# Patient Record
Sex: Male | Born: 2005 | Race: Black or African American | Hispanic: No | Marital: Single | Smoking: Current some day smoker
Health system: Southern US, Community
[De-identification: ages and names within clinical notes are randomized; demographics above are authoritative.]

## PROBLEM LIST (undated history)

## (undated) ENCOUNTER — Inpatient Hospital Stay: Admission: EM | Payer: Self-pay | Source: Home / Self Care

## (undated) DIAGNOSIS — I82409 Acute embolism and thrombosis of unspecified deep veins of unspecified lower extremity: Secondary | ICD-10-CM

## (undated) HISTORY — PX: TONSILLECTOMY: SUR1361

## (undated) HISTORY — PX: TONSILLECTOMY AND ADENOIDECTOMY: SHX28

---
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
## 2023-01-01 LAB — CARDIOLIPIN ANTIBODIES, IGM+IGG
Anticardiolipin IgG: <9 GPL U/mL (ref 0–14)
Anticardiolipin IgM: <9 MPL U/mL (ref 0–12)

## 2023-01-01 LAB — FACTOR 8 ASSAY: Coagulation Factor VIII: 139 % (ref 56–140)

## 2023-01-01 LAB — RHEUMATOID FACTOR: Rheumatoid fact SerPl-aCnc: <10 IU/mL (ref <14.0)

## 2023-01-02 LAB — HGB FRACTIONATION CASCADE
Hgb A2: 3 % (ref 1.8–3.2)
Hgb A: 97 % (ref 96.4–98.8)
Hgb F: 0 % (ref 0.0–2.0)
Hgb S: 0 %

## 2023-01-03 ENCOUNTER — Telehealth: Payer: Self-pay | Admitting: Oncology

## 2023-01-03 LAB — BETA-2-GLYCOPROTEIN I ABS, IGG/M/A
Beta-2 Glyco I IgG: <9 GPI IgG units (ref 0–20)
Beta-2-Glycoprotein I IgA: <9 GPI IgA units (ref 0–25)
Beta-2-Glycoprotein I IgM: <9 GPI IgM units (ref 0–32)

## 2023-01-03 NOTE — Telephone Encounter (Signed)
Left patient a vm regarding upcoming appointments  

## 2023-01-05 DIAGNOSIS — Z91199 Patient's noncompliance with other medical treatment and regimen due to unspecified reason: Secondary | ICD-10-CM | POA: Insufficient documentation

## 2023-01-05 DIAGNOSIS — F1210 Cannabis abuse, uncomplicated: Secondary | ICD-10-CM | POA: Insufficient documentation

## 2023-01-05 DIAGNOSIS — Z7901 Long term (current) use of anticoagulants: Secondary | ICD-10-CM | POA: Insufficient documentation

## 2023-01-05 LAB — URINE DRUGS OF ABUSE SCREEN W ALC, ROUTINE (REF LAB)
Amphetamines, Urine: NEGATIVE ng/mL
Barbiturate, Ur: NEGATIVE ng/mL
Benzodiazepine Quant, Ur: NEGATIVE ng/mL
Cocaine (Metab.): NEGATIVE ng/mL
Ethanol U, Quan: NEGATIVE %
Methadone Screen, Urine: NEGATIVE ng/mL
Opiate Quant, Ur: NEGATIVE ng/mL
Phencyclidine, Ur: NEGATIVE ng/mL
Propoxyphene, Urine: NEGATIVE ng/mL

## 2023-01-05 LAB — PANEL 799049
CARBOXY THC GC/MS CONF: 8550 ng/mL
Cannabinoid GC/MS, Ur: POSITIVE — AB

## 2023-01-20 ENCOUNTER — Inpatient Hospital Stay: Payer: Medicaid Other

## 2023-01-20 ENCOUNTER — Inpatient Hospital Stay: Payer: Medicaid Other | Attending: Oncology | Admitting: Oncology

## 2023-01-20 NOTE — Progress Notes (Deleted)
[facility] Follow up Visit:  Patient Care Team: Leslie Dales, DO as PCP - General (Family Medicine)  CHIEF COMPLAINTS/PURPOSE OF CONSULTATION:   HISTORY OF PRESENTING ILLNESS: Ricky Carroll 17 y.o. 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  Was found to have a right calf vein DVT.  No inciting events recalled but may have had a viral illness in the weeks prior.  Was treated with heparin followed by rivaroxaban  CTPA.  Bilateral pulmonary emboli right greater than left with evidence of mild right heart strain Bilateral lower extremity Dopplers notable for thrombus throughout the right lower extremity deep system WBC 13.0 hemoglobin 14.7 platelet count 344; 76 segs 11 lymphs 11 monos 1 EO CMP notable for glucose of 132 chloride 97 glucose 105 COVID negative Ethyl alcohol negative Lupus anticoagulant testing negative Protein see activity 71% S activity 32% plasminogen activity 102% Prothrombin gene mutation testing negative factor V Leiden gene mutation testing negative  November 24, 2022: CT PA small volume right greater than left pulmonary emboli decreased clot burden compared with January 17, 2022.  Given longtime interval favor to represent acute/subacute rather than chronic residual thrombus.  Diffuse groundglass nodularity most confluent in left lower lobe.  Mild thoracic adenopathy favored to be reactive Patient tested positive for parainfluenza virus 1  December 30 2022:  [facility] Hematology Consult Patient states he was on anticoagulation for only about 2 months following the first PE because her ran out of medicine then missed follow up visit.  He states that he has been on Xarelto since diagnosis of PE in December 2023.  Claims compliance with medication.  Prior to each event he had cramping of legs and vomiting.  No SOB, chest pain.  Social:  Single.  High school student.  Smokes marijuana a few  times a day.  Does not smoke cigarettes.  Does not vape.  EtOH not really.  Denies use of other drugs  [facility] Mother alive 54 well Father alive 39 had DVT  Has 43 half brothers and 71 half sisters.  No history of DVT  WBC 5.6 hemoglobin 16.6 platelet count 203; 53 segs 31 lymphs 11 monos 3 eos 1 basophil Hemoglobin electrophoresis showed normal adult pattern Anticardiolipin antibody negative antibeta 2 glycoprotein antibody negative. Fibrinogen 459 D-dimer 0.33 factor VIII 139 ANA panel and rheumatoid factor negative CMP notable for glucose of 69 creatinine 0.74 Urine drug screen strongly positive for THC  Review of Systems  Constitutional:  Positive for unexpected weight change. Negative for appetite change, chills, fatigue and fever.       Occasionally has has night sweats.  Has lost weight in the past year  HENT:   Negative for lump/mass, mouth sores, nosebleeds, sore throat, tinnitus, trouble swallowing and voice change.   Eyes:  Negative for eye problems and icterus.       Vision changes:  None  Respiratory:  Negative for chest tightness, hemoptysis, shortness of breath and wheezing.        Slight cough.  Scant sputum production   Cardiovascular:  Negative for chest pain, leg swelling and palpitations.       PND:  none Orthopnea:  none  Gastrointestinal:  Negative for abdominal distention, abdominal pain, blood in stool, constipation, diarrhea, nausea and vomiting.  Endocrine: Negative for hot flashes.       Cold intolerance:  none Heat intolerance:  none  Musculoskeletal:  Negative for arthralgias, back  pain, gait problem, myalgias, neck pain and neck stiffness.  Skin:  Negative for itching, rash and wound.  Neurological:  Negative for dizziness, extremity weakness, gait problem, headaches, light-headedness, numbness, seizures and speech difficulty.  Hematological:  Negative for adenopathy. Does not bruise/bleed easily.  Psychiatric/Behavioral:  Negative for sleep disturbance and  suicidal ideas. The patient is not nervous/anxious.     MEDICAL HISTORY: Past Medical History:  Diagnosis Date   DVT (deep venous thrombosis) (HCC)     SURGICAL HISTORY: Past Surgical History:  Procedure Laterality Date   TONSILLECTOMY     TONSILLECTOMY     TONSILLECTOMY AND ADENOIDECTOMY Bilateral     SOCIAL HISTORY: Social History   Socioeconomic History   Marital status: Single    Spouse name: Not on file   Number of children: Not on file   Years of education: Not on file   Highest education level: Not on file  Occupational History   Not on file  Tobacco Use   Smoking status: Never    Passive exposure: Yes   Smokeless tobacco: Never  Substance and Sexual Activity   Alcohol use: No   Drug use: No   Sexual activity: Never  Other Topics Concern   Not on file  Social History Narrative   Hoyte is in fifth grade at [employer]. He is doing well.   Lives with his mother and older brother. Mother is expecting her third child.   Social Determinants of Health   Financial Resource Strain: Not on file  Food Insecurity: No Food Insecurity (11/28/2022)   Hunger Vital Sign    Worried About Running Out of Food in the Last Year: Never true    Ran Out of Food in the Last Year: Never true  Transportation Needs: No Transportation Needs (11/28/2022)   PRAPARE - Hydrologist (Medical): No    Lack of Transportation (Non-Medical): No  Physical Activity: Not on file  Stress: Not on file  Social Connections: Not on file  Intimate Partner Violence: Not At Risk (11/28/2022)   Humiliation, Afraid, Rape, and Kick questionnaire    Fear of Current or Ex-Partner: No    Emotionally Abused: No    Physically Abused: No    Sexually Abused: No    FAMILY HISTORY Family History  Problem Relation Age of Onset   Migraines Mother     ALLERGIES:  has No Known Allergies.  MEDICATIONS:  Current Outpatient Medications  Medication Sig Dispense  Refill   acetaminophen (TYLENOL) 325 MG tablet Take 2 tablets (650 mg total) by mouth every 6 (six) hours as needed for mild pain or moderate pain. (Patient not taking: Reported on 12/01/2022)     feeding supplement (ENSURE ENLIVE / ENSURE PLUS) LIQD Take 237 mLs by mouth 2 (two) times daily between meals. (Patient not taking: Reported on 12/01/2022) 237 mL 12   rivaroxaban (XARELTO) 20 MG TABS tablet Take 1 tablet (20 mg total) by mouth daily with supper. 30 tablet 1   rivaroxaban (XARELTO) 20 MG TABS tablet Take 1 tablet (20 mg total) by mouth daily with supper. 7 tablet 0   No current facility-administered medications for this visit.    PHYSICAL EXAMINATION:  ECOG PERFORMANCE STATUS: 0 - Asymptomatic   There were no vitals filed for this visit.   There were no vitals filed for this visit.    Physical Exam Vitals and nursing note reviewed.  Constitutional:      Appearance: Normal appearance. He  is not diaphoretic.     Comments: Here with mother.  Apparent odor of cannabis eminating from patient   HENT:     Head: Normocephalic and atraumatic.     Right Ear: External ear normal.     Left Ear: External ear normal.     Nose: Nose normal.  Eyes:     General: No scleral icterus.    Conjunctiva/sclera: Conjunctivae normal.     Pupils: Pupils are equal, round, and reactive to light.  Cardiovascular:     Rate and Rhythm: Normal rate and regular rhythm.     Heart sounds: Normal heart sounds. No murmur heard.    No friction rub. No gallop.  Pulmonary:     Effort: Pulmonary effort is normal. No respiratory distress.     Breath sounds: Normal breath sounds. No stridor. No wheezing or rhonchi.  Abdominal:     General: Bowel sounds are normal.     Palpations: Abdomen is soft.     Tenderness: There is no abdominal tenderness. There is no guarding.  Musculoskeletal:        General: No swelling, tenderness or signs of injury. Normal range of motion.     Cervical back: Normal range of  motion and neck supple. No rigidity or tenderness.  Lymphadenopathy:     Head:     Right side of head: No submental, submandibular, tonsillar, preauricular, posterior auricular or occipital adenopathy.     Left side of head: No submental, submandibular, tonsillar, preauricular, posterior auricular or occipital adenopathy.     Cervical: No cervical adenopathy.     Right cervical: No superficial, deep or posterior cervical adenopathy.    Left cervical: No superficial, deep or posterior cervical adenopathy.     Upper Body:     Right upper body: No supraclavicular or axillary adenopathy.     Left upper body: No supraclavicular or axillary adenopathy.     Lower Body: No right inguinal adenopathy. No left inguinal adenopathy.  Skin:    Coloration: Skin is not jaundiced.     Findings: No bruising.  Neurological:     General: No focal deficit present.     Mental Status: He is alert and oriented to person, place, and time.     Cranial Nerves: No cranial nerve deficit.  Psychiatric:        Mood and Affect: Mood normal.        Thought Content: Thought content normal.     Comments: Judgement limited.        LABORATORY DATA: I have personally reviewed the data as listed:  Clinical Support on 12/30/2022  Component Date Value Ref Range Status   Rhuematoid fact SerPl-aCnc 12/30/2022 <10.0  <14.0 IU/mL Final   Comment: (NOTE) Performed At: [facility] [place], [place] [id number] Rush Farmer MD [id number]    ds DNA Ab 12/30/2022 <1  0 - 9 IU/mL Final   Comment: (NOTE)                                   Negative      <5                                   Equivocal  5 - 9  Positive      >9    Ribonucleic Protein 12/30/2022 <0.2  0.0 - 0.9 AI Final   ENA SM Ab Ser-aCnc 12/30/2022 <0.2  0.0 - 0.9 AI Final   Scleroderma (Scl-70) (ENA) Antibod* 12/30/2022 <0.2  0.0 - 0.9 AI Final   SSA (Ro) (ENA) Antibody, IgG 12/30/2022 <0.2  0.0 -  0.9 AI Final   SSB (La) (ENA) Antibody, IgG 12/30/2022 <0.2  0.0 - 0.9 AI Final   Chromatin Ab SerPl-aCnc 12/30/2022 <0.2  0.0 - 0.9 AI Final   Anti JO-1 12/30/2022 <0.2  0.0 - 0.9 AI Final   Centromere Ab Screen 12/30/2022 <0.2  0.0 - 0.9 AI Final   See below: 12/30/2022 Comment   Final   Comment: (NOTE) Autoantibody                       Disease Association ------------------------------------------------------------                        Condition                  Frequency ---------------------   ------------------------   --------- Antinuclear Antibody,    SLE, mixed connective Direct (ANA-D)           tissue diseases ---------------------   ------------------------   --------- dsDNA                    SLE                        40 - 60% ---------------------   ------------------------   --------- Chromatin                Drug induced SLE                90%                         SLE                        48 - 97% ---------------------   ------------------------   --------- SSA (Ro)                 SLE                        25 - 35%                         Sjogren's Syndrome         40 - 70%                         Neonatal Lupus                 100% ---------------------   ------------------------   --------- SSB (La)                 SLE                                                       10%  Sjogren's Syndrome              30% ---------------------   -----------------------    --------- Sm (anti-Smith)          SLE                        15 - 30% ---------------------   -----------------------    --------- RNP                      Mixed Connective Tissue                         Disease                         95% (U1 nRNP,                SLE                        30 - 50% anti-ribonucleoprotein)  Polymyositis and/or                         Dermatomyositis                 20% ---------------------   ------------------------   --------- Scl-70  (antiDNA          Scleroderma (diffuse)      20 - 35% topoisomerase)           Crest                           13% ---------------------   ------------------------   --------- Jo-1                     Polymyositis and/or                         Dermatomyositis            20 - 40% ---------------------   ------------------------   --------- Centromere B             Scleroderma -                           Crest                         variant                         80% Performed At: [facility] [place], [place] [id number] Rush Farmer MD [id number]    Hgb F 12/30/2022 0.0  0.0 - 2.0 % Final   Hgb A 12/30/2022 97.0  96.4 - 98.8 % Final   Hgb A2 12/30/2022 3.0  1.8 - 3.2 % Final   Hgb S 12/30/2022 0.0  0.0 % Final   Interpretation, Hgb Fract 12/30/2022 Comment   Final   Comment: (NOTE) Normal hemoglobin present; no hemoglobin variant or beta thalassemia identified. Note: Alpha thalassemia may not be detected by the Hgb Fractionation Cascade panel. If alpha thalassemia is suspected, [facility] offers Alpha-Thalassemia DNA Analysis [id number]). Performed At: [facility] [place], [place] [id number] Rush Farmer MD [id number]  Amphetamines, Urine 12/30/2022 Negative  Cutoff=1000 ng/mL Final   Amphetamine test includes Amphetamine and Methamphetamine.   Barbiturate, Ur 12/30/2022 Negative  Cutoff=300 ng/mL Final   Benzodiazepine Quant, Ur 12/30/2022 Negative  Cutoff=300 ng/mL Final   Cannabinoid Quant, Ur 12/30/2022 See Final Results  Cutoff=50 ng/mL Final   Cocaine (Metab.) 12/30/2022 Negative  Cutoff=300 ng/mL Final   Opiate Quant, Ur 12/30/2022 Negative  Cutoff=300 ng/mL Final   Opiate test includes Codeine and Morphine only.   Phencyclidine, Ur 12/30/2022 Negative  Cutoff=25 ng/mL Final   Methadone Screen, Urine 12/30/2022 Negative  Cutoff=300 ng/mL Final   Propoxyphene, Urine 12/30/2022 Negative  Cutoff=300 ng/mL Final    Ethanol U, Alease Frame 12/30/2022 Negative  Cutoff=0.020 % Final   Comment: (NOTE) Performed At: [facility] [address] [place], [place] [id number] Avis Epley PhD [id number]    D-Dimer, Quant 12/30/2022 0.33  0.00 - 0.50 ug/mL-FEU Final   Comment: (NOTE) At the manufacturer cut-off value of 0.5 g/mL FEU, this assay has a negative predictive value of 95-100%.This assay is intended for use in conjunction with a clinical pretest probability (PTP) assessment model to exclude pulmonary embolism (PE) and deep venous thrombosis (DVT) in outpatients suspected of PE or DVT. Results should be correlated with clinical presentation. Performed at [facility], [place] [address], [place], [place] [address]    Cannabinoid GC/MS, Ur 12/30/2022 Positive (A)  Cutoff=50 Final   CARBOXY THC GC/MS CONF 12/30/2022 8,550  Cutoff=10 ng/mL Final   Comment: (NOTE) Performed At: [facility] [address] [place], [place] [id number] Avis Epley PhD [id number]   Office Visit on 12/30/2022  Component Date Value Ref Range Status   WBC 12/30/2022 5.6  4.0 - 10.5 K/uL Final   RBC 12/30/2022 5.60  4.22 - 5.81 MIL/uL Final   Hemoglobin 12/30/2022 16.6  13.0 - 17.0 g/dL Final   HCT 12/30/2022 47.7  39.0 - 52.0 % Final   MCV 12/30/2022 85.2  80.0 - 100.0 fL Final   MCH 12/30/2022 29.6  26.0 - 34.0 pg Final   MCHC 12/30/2022 34.8  30.0 - 36.0 g/dL Final   RDW 12/30/2022 15.0  11.5 - 15.5 % Final   Platelets 12/30/2022 203  150 - 400 K/uL Final   nRBC 12/30/2022 0.0  0.0 - 0.2 % Final   Neutrophils Relative % 12/30/2022 53  % Final   Neutro Abs 12/30/2022 3.0  1.7 - 7.7 K/uL Final   Lymphocytes Relative 12/30/2022 31  % Final   Lymphs Abs 12/30/2022 1.7  0.7 - 4.0 K/uL Final   Monocytes Relative 12/30/2022 11  % Final   Monocytes Absolute 12/30/2022 0.6  0.1 - 1.0 K/uL Final   Eosinophils Relative 12/30/2022 3  % Final   Eosinophils Absolute 12/30/2022 0.2  0.0 - 0.5 K/uL Final    Basophils Relative 12/30/2022 1  % Final   Basophils Absolute 12/30/2022 0.1  0.0 - 0.1 K/uL Final   Immature Granulocytes 12/30/2022 1  % Final   Abs Immature Granulocytes 12/30/2022 0.03  0.00 - 0.07 K/uL Final   Performed at [facility], [place] [address] [place], [place] [address]   Sodium 12/30/2022 140  135 - 145 mmol/L Final   Potassium 12/30/2022 3.8  3.5 - 5.1 mmol/L Final   Chloride 12/30/2022 106  98 - 111 mmol/L Final   CO2 12/30/2022 30  22 - 32 mmol/L Final   Glucose, Bld 12/30/2022 69 (L)  70 - 99 mg/dL  Final   Glucose reference range applies only to samples taken after fasting for at least 8 hours.   BUN 12/30/2022 10  6 - 20 mg/dL Final   Creatinine, Ser 12/30/2022 0.74  0.61 - 1.24 mg/dL Final   Calcium 12/30/2022 9.9  8.9 - 10.3 mg/dL Final   Total Protein 12/30/2022 7.4  6.5 - 8.1 g/dL Final   Albumin 12/30/2022 4.4  3.5 - 5.0 g/dL Final   AST 12/30/2022 15  15 - 41 U/L Final   ALT 12/30/2022 13  0 - 44 U/L Final   Alkaline Phosphatase 12/30/2022 90  38 - 126 U/L Final   Total Bilirubin 12/30/2022 0.5  0.3 - 1.2 mg/dL Final   GFR, Estimated 12/30/2022 >60  >60 mL/min Final   Comment: (NOTE) Calculated using the CKD-EPI Creatinine Equation (2021)    Anion gap 12/30/2022 4 (L)  5 - 15 Final   Performed at [id number], [place] [address], [place], [place] [address]   Anticardiolipin IgG 12/30/2022 <9  0 - 14 GPL U/mL Final   Comment: (NOTE)                          Negative:              <15                          Indeterminate:     15 - 20                          Low-Med Positive: >20 - 80                          High Positive:         >80    Anticardiolipin IgM 12/30/2022 <9  0 - 12 MPL U/mL Final   Comment: (NOTE)                          Negative:              <13                          Indeterminate:     13 - 20                          Low-Med Positive: >20 - 80                          High Positive:          >80 Performed At: [facility] [place], [place] [id number] Rush Farmer MD [id number]    Beta-2 Glyco I IgG 12/30/2022 <9  0 - 20 GPI IgG units Final   Comment: (NOTE) The reference interval reflects a 3SD or 99th percentile interval, which is thought to represent a potentially clinically significant result in accordance with the International Consensus Statement on the classification criteria for definitive antiphospholipid syndrome (APS). J Thromb Haem 2006;4:295-306.    Beta-2-Glycoprotein I IgM 12/30/2022 <9  0 - 32 GPI IgM units Final   Comment: (NOTE) The reference interval reflects a 3SD or 99th percentile interval, which is thought to represent a potentially clinically significant  result in accordance with the International Consensus Statement on the classification criteria for definitive antiphospholipid syndrome (APS). J Thromb Haem 2006;4:295-306. Performed At: [facility] [place], [place] [id number] Rush Farmer MD [id number]    Beta-2-Glycoprotein I IgA 12/30/2022 <9  0 - 25 GPI IgA units Final   Comment: (NOTE) The reference interval reflects a 3SD or 99th percentile interval, which is thought to represent a potentially clinically significant result in accordance with the International Consensus Statement on the classification criteria for definitive antiphospholipid syndrome (APS). J Thromb Haem 2006;4:295-306.    Fibrinogen 12/30/2022 459  210 - 475 mg/dL Final   Comment: (NOTE) Fibrinogen results may be underestimated in patients receiving thrombolytic therapy. Performed at [facility], [place] [address] [place], [place] [address]    Coagulation Factor VIII 12/30/2022 139  56 - 140 % Final   Comment: (NOTE) Performed At: [facility] [place], [place] [id number] Rush Farmer MD [id number]     RADIOGRAPHIC STUDIES: I have personally reviewed the  radiological images as listed and agree with the findings in the report  No results found.  ASSESSMENT/PLAN  17 y.o. male is here because of recurrent PE  Recurrent PE: Patient has history of PE x 2 January 17 2022:  Presented with unprovoked VTE.  CT PA showed bilateral PE without right strain.  LE dopplers showed RLE thrombus.  Treated with Heparin followed by Rivaroxiban.  Did not complete course of Rivaroxiban   November 24 2022:  Presented  with parainfluenza infection and subacute PE diagnosed by CT PA  Hypercoagulable state evaluation:  Performed in December 2023 was notable for low Protein S level.  Would exercise caution of labelling patient as having Protein S deficiency since this level can be reduced in the setting of acute thrombosis.   I will obtain CBC with diff, CMP, cardiolipin antibody, beta-2 glycoprotein antibody, fibrinogen, factor VIII, fibrinogen, D-dimer, ANA, hemoglobin fractionation pattern, urine drug screen  VTE risk factors: The most obvious risk factor for the first VTE is patient's avid use of marijuana.  The patient claims that he is not a casual user but in fact that is not the case.  Per patient's mother he spends most of his day smoking marijuana and in fact on exam the older of cannabis was quite obvious.  The risk factors for the second VTE were likely noncompliance with an adequate duration of anticoagulation and a concomitant respiratory illness.  Therapeutics: Recommend continued anticoagulation with full dose Xarelto or Eliquis.  Explained to patient the importance of compliance last he develop another episode of VTE which could prove fatal or debilitating.  The extent to which he will he these warnings remains to be seen as he spent most of the visit staring into his cell phone   Cancer Staging  No matching staging information was found for the patient.   No problem-specific Assessment & Plan notes found for this encounter.   No orders of the  defined types were placed in this encounter.  63  minutes was spent in patient care.  This included time spent preparing to see the patient (e.g., review of tests), obtaining and/or reviewing separately obtained history, counseling and educating the patient/family/caregiver, ordering medications, tests, or procedures; documenting clinical information in the electronic or other health record, independently interpreting results and communicating results to the patient/family/caregiver as well as coordination of care.      All questions were answered. The patient knows to call the  clinic with any problems, questions or concerns.  This note was electronically signed.    Barbee Cough, MD  01/20/2023 1:25 PM

## 2023-02-27 ENCOUNTER — Ambulatory Visit: Payer: Self-pay | Admitting: Student

## 2023-03-19 ENCOUNTER — Emergency Department (HOSPITAL_COMMUNITY): Payer: Medicaid Other

## 2023-03-19 ENCOUNTER — Other Ambulatory Visit: Payer: Self-pay

## 2023-03-19 ENCOUNTER — Emergency Department (HOSPITAL_COMMUNITY)
Admission: EM | Admit: 2023-03-19 | Discharge: 2023-03-19 | Disposition: A | Discharge status: Home / Self Care | Payer: Medicaid Other | Attending: Emergency Medicine | Admitting: Emergency Medicine

## 2023-03-19 DIAGNOSIS — M7918 Myalgia, other site: Secondary | ICD-10-CM | POA: Diagnosis not present

## 2023-03-19 DIAGNOSIS — Z20822 Contact with and (suspected) exposure to covid-19: Secondary | ICD-10-CM | POA: Diagnosis not present

## 2023-03-19 DIAGNOSIS — R52 Pain, unspecified: Secondary | ICD-10-CM

## 2023-03-19 DIAGNOSIS — R059 Cough, unspecified: Secondary | ICD-10-CM | POA: Diagnosis present

## 2023-03-19 DIAGNOSIS — Z7901 Long term (current) use of anticoagulants: Secondary | ICD-10-CM | POA: Insufficient documentation

## 2023-03-19 DIAGNOSIS — J189 Pneumonia, unspecified organism: Secondary | ICD-10-CM | POA: Diagnosis not present

## 2023-03-19 DIAGNOSIS — I2699 Other pulmonary embolism without acute cor pulmonale: Secondary | ICD-10-CM

## 2023-03-19 LAB — BASIC METABOLIC PANEL
Anion gap: 9 (ref 5–15)
BUN: 9 mg/dL (ref 6–20)
CO2: 24 mmol/L (ref 22–32)
Calcium: 9.2 mg/dL (ref 8.9–10.3)
Chloride: 106 mmol/L (ref 98–111)
Creatinine, Ser: 0.92 mg/dL (ref 0.61–1.24)
GFR, Estimated: >60 mL/min (ref >60)
Glucose, Bld: 95 mg/dL (ref 70–99)
Potassium: 3.3 mmol/L — ABNORMAL LOW (ref 3.5–5.1)
Sodium: 139 mmol/L (ref 135–145)

## 2023-03-19 LAB — URINALYSIS, ROUTINE W REFLEX MICROSCOPIC
Bilirubin Urine: NEGATIVE
Glucose, UA: NEGATIVE mg/dL
Hgb urine dipstick: NEGATIVE
Ketones, ur: 20 mg/dL — AB
Leukocytes,Ua: NEGATIVE
Nitrite: NEGATIVE
Protein, ur: NEGATIVE mg/dL
Specific Gravity, Urine: >1.046 — ABNORMAL HIGH (ref 1.005–1.030)
pH: 7 (ref 5.0–8.0)

## 2023-03-19 LAB — CBC
HCT: 46 % (ref 39.0–52.0)
Hemoglobin: 15.5 g/dL (ref 13.0–17.0)
MCH: 29.4 pg (ref 26.0–34.0)
MCHC: 33.7 g/dL (ref 30.0–36.0)
MCV: 87.1 fL (ref 80.0–100.0)
Platelets: 186 10*3/uL (ref 150–400)
RBC: 5.28 MIL/uL (ref 4.22–5.81)
RDW: 14.1 % (ref 11.5–15.5)
WBC: 5.9 10*3/uL (ref 4.0–10.5)
nRBC: 0 % (ref 0.0–0.2)

## 2023-03-19 LAB — RESP PANEL BY RT-PCR (RSV, FLU A&B, COVID)  RVPGX2
Influenza A by PCR: NEGATIVE
Influenza B by PCR: NEGATIVE
Resp Syncytial Virus by PCR: NEGATIVE
SARS Coronavirus 2 by RT PCR: NEGATIVE

## 2023-03-19 LAB — TROPONIN I (HIGH SENSITIVITY)
Troponin I (High Sensitivity): 3 ng/L (ref <18)
Troponin I (High Sensitivity): 2 ng/L (ref <18)

## 2023-03-19 MED ORDER — ACETAMINOPHEN 500 MG PO TABS
1000.0000 mg | ORAL_TABLET | Freq: Once | ORAL | Status: AC
  Administered 2023-03-19: 1000 mg via ORAL
  Filled 2023-03-19: qty 2

## 2023-03-19 MED ORDER — SODIUM CHLORIDE 0.9 % IV SOLN
1.0000 g | Freq: Once | INTRAVENOUS | Status: AC
  Administered 2023-03-19: 1 g via INTRAVENOUS
  Filled 2023-03-19: qty 10

## 2023-03-19 MED ORDER — SODIUM CHLORIDE 0.9 % IV SOLN
500.0000 mg | Freq: Once | INTRAVENOUS | Status: AC
  Administered 2023-03-19: 500 mg via INTRAVENOUS
  Filled 2023-03-19: qty 5

## 2023-03-19 MED ORDER — IOPAMIDOL (ISOVUE-370) INJECTION 76%
75.0000 mL | Freq: Once | INTRAVENOUS | Status: AC | PRN
  Administered 2023-03-19: 75 mL via INTRAVENOUS

## 2023-03-19 MED ORDER — AMOXICILLIN-POT CLAVULANATE 875-125 MG PO TABS: 1.0000 | ORAL_TABLET | Freq: Two times a day (BID) | ORAL | 0 refills | Status: DC

## 2023-03-19 MED ORDER — RIVAROXABAN 20 MG PO TABS: 20.0000 mg | ORAL_TABLET | Freq: Every day | ORAL | 1 refills | Status: DC

## 2023-03-19 MED ORDER — ONDANSETRON HCL 4 MG/2ML IJ SOLN
4.0000 mg | Freq: Once | INTRAMUSCULAR | Status: AC
  Administered 2023-03-19: 4 mg via INTRAVENOUS
  Filled 2023-03-19: qty 2

## 2023-03-19 MED ORDER — DOXYCYCLINE HYCLATE 100 MG PO CAPS: 100.0000 mg | ORAL_CAPSULE | Freq: Two times a day (BID) | ORAL | 0 refills | Status: AC

## 2023-03-19 NOTE — Discharge Instructions (Signed)
It was a pleasure taking care of you today!  Your CT scan did not show any concerns for blood clot in your lung however did show concerns for pneumonia in both of your lungs.  You will be sent a prescription for your Xarelto that is to treat your history of blood clots, it is important that you continue taking this medication and do not miss any doses of this med.  You will also be sent a prescription for Augmentin and doxycycline to treat for your pneumonia.  You may take over-the-counter 500 mg Tylenol every 6 hours as needed for pain/fever.  It is important to call your primary care doctor tomorrow to be evaluated in the office this week.  It is also important that you maintain follow-up with your hematologist. Return to the ED if you are experiencing increasing/worsening symptoms including but not limited to chest pain, shortness of breath, swelling in the legs, worsening symptoms.

## 2023-03-19 NOTE — ED Provider Notes (Signed)
[facility] Provider Note   CSN: [id number] Arrival date & time: 03/19/23  [id number]     History  Chief Complaint  Patient presents with   Generalized Body Aches    DAVIE SCARPULLA is a 17 y.o. male with a past medical history of PE who presents emergency department with concerns for generalized bodyaches x 2-3 days.  Notes sick contacts at home with similar symptoms.  Has associated productive cough with green sputum, sternal chest pain.  No meds tried at home.  Denies shortness of breath, nausea, vomiting, leg swelling.  Notes that he has been off his Xarelto for a month.  He was unable to follow-up with the care team that he was provided with due to misplacing information for her to follow-up with.  The history is provided by the patient. No language interpreter was used.       Home Medications Prior to Admission medications   Medication Sig Start Date End Date Taking? Authorizing Provider  amoxicillin-clavulanate (AUGMENTIN) 875-125 MG tablet Take 1 tablet by mouth every 12 (twelve) hours. 03/19/23  Yes Evita Merida A, PA-C  doxycycline (VIBRAMYCIN) 100 MG capsule Take 1 capsule (100 mg total) by mouth 2 (two) times daily for 5 days. 03/19/23 03/24/23 Yes Elmyra Banwart A, PA-C  acetaminophen (TYLENOL) 325 MG tablet Take 2 tablets (650 mg total) by mouth every 6 (six) hours as needed for mild pain or moderate pain. Patient not taking: Reported on 12/01/2022 11/29/22   Jone Baseman, MD  feeding supplement (ENSURE ENLIVE / ENSURE PLUS) LIQD Take 237 mLs by mouth 2 (two) times daily between meals. Patient not taking: Reported on 12/01/2022 11/29/22   Jone Baseman, MD  rivaroxaban (XARELTO) 20 MG TABS tablet Take 1 tablet (20 mg total) by mouth daily with supper. 12/29/22   Leslie Dales, DO  rivaroxaban (XARELTO) 20 MG TABS tablet Take 1 tablet (20 mg total) by mouth daily with supper. 03/19/23   Fayola Meckes A, PA-C      Allergies    Patient has  no known allergies.    Review of Systems   Review of Systems  All other systems reviewed and are negative.   Physical Exam Updated Vital Signs BP 129/82   Pulse 96   Temp 97.8 F (36.6 C) (Oral) Comment: Simultaneous filing. User may not have seen previous data.  Resp 16   SpO2 99%  Physical Exam Vitals and nursing note reviewed.  Constitutional:      General: He is not in acute distress.    Appearance: He is not diaphoretic.  HENT:     Head: Normocephalic and atraumatic.     Mouth/Throat:     Pharynx: No oropharyngeal exudate.  Eyes:     General: No scleral icterus.    Conjunctiva/sclera: Conjunctivae normal.  Cardiovascular:     Rate and Rhythm: Normal rate and regular rhythm.     Pulses: Normal pulses.     Heart sounds: Normal heart sounds.  Pulmonary:     Effort: Pulmonary effort is normal. No respiratory distress.     Breath sounds: Normal breath sounds. No wheezing.  Chest:     Chest wall: No tenderness.     Comments: No chest wall TTP.  Abdominal:     General: Bowel sounds are normal.     Palpations: Abdomen is soft. There is no mass.     Tenderness: There is no abdominal tenderness. There is no guarding or rebound.  Musculoskeletal:  General: Normal range of motion.     Cervical back: Normal range of motion and neck supple.     Comments: No unilateral swelling noted to BLE.   Skin:    General: Skin is warm and dry.  Neurological:     Mental Status: He is alert.  Psychiatric:        Behavior: Behavior normal.     ED Results / Procedures / Treatments   Labs (all labs ordered are listed, but only abnormal results are displayed) Labs Reviewed  BASIC METABOLIC PANEL - Abnormal; Notable for the following components:      Result Value   Potassium 3.3 (*)    All other components within normal limits  URINALYSIS, ROUTINE W REFLEX MICROSCOPIC - Abnormal; Notable for the following components:   Specific Gravity, Urine >1.046 (*)    Ketones, ur 20 (*)     All other components within normal limits  RESP PANEL BY RT-PCR (RSV, FLU A&B, COVID)  RVPGX2  CBC  TROPONIN I (HIGH SENSITIVITY)  TROPONIN I (HIGH SENSITIVITY)    EKG None  Radiology CT Angio Chest PE W and/or Wo Contrast  Result Date: 03/19/2023 CLINICAL DATA:  History of pulmonary embolism with diffuse body pain after discontinuing anticoagulation EXAM: CT ANGIOGRAPHY CHEST WITH CONTRAST TECHNIQUE: Multidetector CT imaging of the chest was performed using the standard protocol during bolus administration of intravenous contrast. Multiplanar CT image reconstructions and MIPs were obtained to evaluate the vascular anatomy. RADIATION DOSE REDUCTION: This exam was performed according to the departmental dose-optimization program which includes automated exposure control, adjustment of the mA and/or kV according to patient size and/or use of iterative reconstruction technique. CONTRAST:  31mL ISOVUE-370 IOPAMIDOL (ISOVUE-370) INJECTION 76% COMPARISON:  CTA chest dated 11/24/2022 FINDINGS: Cardiovascular: The study is high quality for the evaluation of pulmonary embolism. There are no filling defects in the central, lobar, segmental or subsegmental pulmonary artery branches to suggest acute pulmonary embolism. Great vessels are normal in course and caliber. Normal heart size. No significant pericardial fluid/thickening. Mediastinum/Nodes: Imaged thyroid gland without nodules meeting criteria for imaging follow-up by size. Normal esophagus. No pathologically enlarged axillary, supraclavicular, mediastinal, or hilar lymph nodes. Lungs/Pleura: The central airways are patent. New and increased conspicuity of bilateral lower lobe central ground-glass opacities and nodularity with additional areas of subtle ground-glass opacities in the central right upper and middle lobes. No pneumothorax. No pleural effusion. Upper abdomen: Normal. Musculoskeletal: No acute or abnormal lytic or blastic osseous lesions.  Review of the MIP images confirms the above findings. IMPRESSION: 1. No evidence of acute pulmonary embolism. 2. New and increased conspicuity of bilateral lower lobe central ground-glass opacities and nodularity with additional areas of subtle ground-glass opacities in the central right upper and middle lobes. Findings are suspicious for an infectious or inflammatory process. Electronically Signed   By: Darrin Nipper M.D.   On: 03/19/2023 14:00   DG Chest 2 View  Result Date: 03/19/2023 CLINICAL DATA:  Generalized body aches. Chest pain with history of PE. EXAM: CHEST - 2 VIEW COMPARISON:  02/17/2021 FINDINGS: Normal heart size and mediastinal contours. No acute infiltrate or edema. No effusion or pneumothorax. No acute osseous findings. IMPRESSION: Normal chest. Electronically Signed   By: Jorje Guild M.D.   On: 03/19/2023 12:19    Procedures Procedures    Medications Ordered in ED Medications  iopamidol (ISOVUE-370) 76 % injection 75 mL (75 mLs Intravenous Contrast Given 03/19/23 1342)  cefTRIAXone (ROCEPHIN) 1 g in sodium chloride 0.9 %  100 mL IVPB (0 g Intravenous Stopped 03/19/23 1618)  azithromycin (ZITHROMAX) 500 mg in sodium chloride 0.9 % 250 mL IVPB (0 mg Intravenous Stopped 03/19/23 1704)  acetaminophen (TYLENOL) tablet 1,000 mg (1,000 mg Oral Given 03/19/23 1455)  ondansetron (ZOFRAN) injection 4 mg (4 mg Intravenous Given 03/19/23 1456)    ED Course/ Medical Decision Making/ A&P Clinical Course as of 03/19/23 2011  Sun Mar 19, 2023  1417 Stable: 53 YOM with a chief complaint of muscle aches. Trops negative. Viral syndrome Negative CTAPE [CC]  1419 Has cough/LLE findings on CTAPE.  Will treat for CAP [CC]  1426 Discussed with patient lab and imaging findings.  Patient noted to be diaphoretic and to have had an episode of emesis.  Temperature rechecked by myself noted temperature at 98.  Discussed with patient treatment plan.  Patient agreeable at this time. [SB]  1636 Patient  reevaluated and resting comfortably on stretcher with his significant other. Discussed with patient importance of adhereing to his treatment plan of the xarelto and the antibiotics for his pneumonia. Pt acknowledged and verbalized understanding. Pt satting at 100% on room air. Answered all available questions. Pt appears safe for discharge.  [SB]    Clinical Course User Index [CC] Tretha Sciara, MD [SB] Pierra Skora A, PA-C                             Medical Decision Making Amount and/or Complexity of Data Reviewed Labs: ordered. Radiology: ordered.  Risk OTC drugs. Prescription drug management.   Pt presents with concerns for generalized body aches x 2-3 days.  Has sick contacts with similar symptoms.  Patient afebrile, not tachycardic or hypoxic.  On exam patient with no chest wall tenderness to palpation.  No unilateral swelling noted to bilateral lower extremities.  Differential diagnosis includes COVID, flu, RSV, pneumonia, PE.  Co morbidities that complicate the patient evaluation: Clotting disorder PE Chronic DVT  Additional history obtained:  External records from outside source obtained and reviewed including: Pt was admitted to the hospital in December 2023 for bilateral PE. Started on xarelto.  Labs:  I ordered, and personally interpreted labs.  The pertinent results include:   Initial troponin 3, delta troponin at 2 Urinalysis unremarkable COVID, flu, RSV swab negative BMP unremarkable CBC unremarkable  Imaging: I ordered imaging studies including chest x-ray, CTA chest PE study I independently visualized and interpreted imaging which showed: Chest x-ray without acute findings.  CTA chest with  1. No evidence of acute pulmonary embolism.  2. New and increased conspicuity of bilateral lower lobe central  ground-glass opacities and nodularity with additional areas of  subtle ground-glass opacities in the central right upper and middle  lobes. Findings are  suspicious for an infectious or inflammatory  process.   I agree with the radiologist interpretation  Medications:  I ordered medication including Zithromax, Rocephin, Zofran, Tylenol for symptom management Reevaluation of the patient after these medicines and interventions, I reevaluated the patient and found that they have improved I have reviewed the patients home medicines and have made adjustments as needed  Disposition: Presentation suspicious for pneumonia.  Doubt concerns at this time for COVID, flu, RSV.  Although no acute PE found on today's CT study, patient with a history of bilateral PEs, chronic DVT, clotting disorder.  Per recent hematology note, patient is to continue on Xarelto.  Doubt concerns at this time for ACS.  After consideration of the diagnostic results  and the patients response to treatment, I feel that the patient would benefit from Discharge home.  Prescription sent for Xarelto, Augmentin, doxycycline.  Discussed with patient in depth importance of making sure that he picks up his medications to take.  Patient overall well-appearing and hemodynamically stable at discharge.  Patient maintaining oxygen levels at approximately 99% at time of discharge.  Orthostatic vital signs within normal limits.  Instructed patient to follow-up with his primary care provider and hematologist this week regarding today's ED visit.  Patient provided with close strict return precautions.  Supportive care measures and strict return precautions discussed with patient at bedside. Pt acknowledges and verbalizes understanding. Pt appears safe for discharge. Follow up as indicated in discharge paperwork.    This chart was dictated using voice recognition software, Dragon. Despite the best efforts of this provider to proofread and correct errors, errors may still occur which can change documentation meaning.   Final Clinical Impression(s) / ED Diagnoses Final diagnoses:  Community acquired  pneumonia, unspecified laterality  Generalized body aches    Rx / DC Orders ED Discharge Orders          Ordered    rivaroxaban (XARELTO) 20 MG TABS tablet  Daily with supper        03/19/23 1650    amoxicillin-clavulanate (AUGMENTIN) 875-125 MG tablet  Every 12 hours        03/19/23 1650    doxycycline (VIBRAMYCIN) 100 MG capsule  2 times daily        03/19/23 1650              Jamal Pavon A, PA-C 03/19/23 2014    Tretha Sciara, MD 03/19/23 2050

## 2023-03-19 NOTE — ED Notes (Signed)
This RN reviewed discharge instructions with patient. He verbalized understanding and denied any further questions. PT well appearing upon discharge and denies pain. Pt ambulated with stable gait to exit. Pt endorses ride home.  

## 2023-03-19 NOTE — ED Notes (Signed)
Orthostatic VS Pt denied SOB, CP and dizziness    03/19/23 1611 03/19/23 1612 03/19/23 1613  Vitals  BP (!) 103/59 103/61 (!) 107/55  MAP (mmHg) 72 74 72  BP Location Right Arm Right Arm Right Arm  BP Method Automatic Automatic Automatic  Patient Position (if appropriate) Lying Sitting Standing  Pulse Rate 68 66 75

## 2023-03-19 NOTE — ED Notes (Signed)
This RN assumed care of patient. He reports general body aches with dull mid sternal chest pain today that has resolved. Pt denies SOB and CP at this time. Pt is resting on gurney at this time, respirations are spontaneous, even, unlabored and symmetrical bilaterally. Pt skin tone is appropriate for ethnicity, dry and warm. Pt connected to CCM, pulse ox and BP.

## 2023-03-19 NOTE — ED Triage Notes (Signed)
Patient with history of PE two months ago and on xarelto here with compliant of diffuse pain through his body after stopping his xarelto one month ago. Patient states he lost his prescription and has not been able to refill it. Patient is alert, oriented, speaking in complete sentences, and is in no apparent distress at this time.

## 2023-04-06 ENCOUNTER — Inpatient Hospital Stay: Payer: Self-pay | Admitting: Student

## 2023-05-05 ENCOUNTER — Emergency Department (HOSPITAL_BASED_OUTPATIENT_CLINIC_OR_DEPARTMENT_OTHER): Payer: Medicaid Other | Admitting: Radiology

## 2023-05-05 ENCOUNTER — Other Ambulatory Visit: Payer: Self-pay

## 2023-05-05 ENCOUNTER — Emergency Department (HOSPITAL_BASED_OUTPATIENT_CLINIC_OR_DEPARTMENT_OTHER)
Admission: EM | Admit: 2023-05-05 | Discharge: 2023-05-05 | Disposition: A | Discharge status: Home / Self Care | Payer: Medicaid Other | Attending: Emergency Medicine | Admitting: Emergency Medicine

## 2023-05-05 ENCOUNTER — Encounter (HOSPITAL_BASED_OUTPATIENT_CLINIC_OR_DEPARTMENT_OTHER): Payer: Self-pay | Admitting: Emergency Medicine

## 2023-05-05 DIAGNOSIS — Z7901 Long term (current) use of anticoagulants: Secondary | ICD-10-CM | POA: Insufficient documentation

## 2023-05-05 DIAGNOSIS — Z86711 Personal history of pulmonary embolism: Secondary | ICD-10-CM | POA: Insufficient documentation

## 2023-05-05 DIAGNOSIS — Z20822 Contact with and (suspected) exposure to covid-19: Secondary | ICD-10-CM | POA: Diagnosis not present

## 2023-05-05 DIAGNOSIS — R059 Cough, unspecified: Secondary | ICD-10-CM | POA: Diagnosis not present

## 2023-05-05 DIAGNOSIS — R0602 Shortness of breath: Secondary | ICD-10-CM | POA: Insufficient documentation

## 2023-05-05 DIAGNOSIS — I2699 Other pulmonary embolism without acute cor pulmonale: Secondary | ICD-10-CM

## 2023-05-05 LAB — BASIC METABOLIC PANEL
Anion gap: 10 (ref 5–15)
BUN: 11 mg/dL (ref 6–20)
CO2: 26 mmol/L (ref 22–32)
Calcium: 9.3 mg/dL (ref 8.9–10.3)
Chloride: 104 mmol/L (ref 98–111)
Creatinine, Ser: 0.98 mg/dL (ref 0.61–1.24)
GFR, Estimated: >60 mL/min (ref >60)
Glucose, Bld: 93 mg/dL (ref 70–99)
Potassium: 3.4 mmol/L — ABNORMAL LOW (ref 3.5–5.1)
Sodium: 140 mmol/L (ref 135–145)

## 2023-05-05 LAB — CBC WITH DIFFERENTIAL/PLATELET
Abs Immature Granulocytes: 0.02 10*3/uL (ref 0.00–0.07)
Basophils Absolute: 0.1 10*3/uL (ref 0.0–0.1)
Basophils Relative: 1 %
Eosinophils Absolute: 0.3 10*3/uL (ref 0.0–0.5)
Eosinophils Relative: 5 %
HCT: 43 % (ref 39.0–52.0)
Hemoglobin: 15.1 g/dL (ref 13.0–17.0)
Immature Granulocytes: 0 %
Lymphocytes Relative: 35 %
Lymphs Abs: 2.3 10*3/uL (ref 0.7–4.0)
MCH: 29.2 pg (ref 26.0–34.0)
MCHC: 35.1 g/dL (ref 30.0–36.0)
MCV: 83.2 fL (ref 80.0–100.0)
Monocytes Absolute: 0.7 10*3/uL (ref 0.1–1.0)
Monocytes Relative: 11 %
Neutro Abs: 3.2 10*3/uL (ref 1.7–7.7)
Neutrophils Relative %: 48 %
Platelets: 269 10*3/uL (ref 150–400)
RBC: 5.17 MIL/uL (ref 4.22–5.81)
RDW: 14.1 % (ref 11.5–15.5)
WBC: 6.6 10*3/uL (ref 4.0–10.5)
nRBC: 0 % (ref 0.0–0.2)

## 2023-05-05 LAB — SARS CORONAVIRUS 2 BY RT PCR: SARS Coronavirus 2 by RT PCR: NEGATIVE

## 2023-05-05 LAB — BRAIN NATRIURETIC PEPTIDE: B Natriuretic Peptide: 5.1 pg/mL (ref 0.0–100.0)

## 2023-05-05 LAB — TROPONIN I (HIGH SENSITIVITY): Troponin I (High Sensitivity): 2 ng/L (ref <18)

## 2023-05-05 LAB — D-DIMER, QUANTITATIVE: D-Dimer, Quant: 0.72 ug/mL-FEU — ABNORMAL HIGH (ref 0.00–0.50)

## 2023-05-05 MED ORDER — RIVAROXABAN (XARELTO) VTE STARTER PACK (15 & 20 MG): ORAL_TABLET | ORAL | 0 refills | Status: DC

## 2023-05-05 MED ORDER — RIVAROXABAN 20 MG PO TABS: 20.0000 mg | ORAL_TABLET | Freq: Every day | ORAL | Status: DC

## 2023-05-05 MED ORDER — ENOXAPARIN SODIUM 100 MG/ML IJ SOSY
1.5000 mg/kg | PREFILLED_SYRINGE | INTRAMUSCULAR | Status: DC
  Administered 2023-05-05: 90 mg via SUBCUTANEOUS
  Filled 2023-05-05: qty 1

## 2023-05-05 MED ORDER — RIVAROXABAN 15 MG PO TABS: 15.0000 mg | ORAL_TABLET | Freq: Two times a day (BID) | ORAL | Status: DC

## 2023-05-05 NOTE — ED Provider Notes (Signed)
[place] EMERGENCY DEPARTMENT AT [facility] Provider Note   CSN: [id number] Arrival date & time: 05/05/23  1908     History  Chief Complaint  Patient presents with   Shortness of Breath    Ricky Carroll is a 17 y.o. male.  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  Denies any fevers but has had some chills.  Says that he ran out of his Xarelto in January and has not filled it since then due to an insurance issue.  Was diagnosed with a PE last December.  Did have a CTA in March that did not show evidence of PE.  Is concerned that he may have blood clots in his legs and his lungs at this time.       Home Medications Prior to Admission medications   Medication Sig Start Date End Date Taking? Authorizing Provider  RIVAROXABAN Carlena Hurl) VTE STARTER PACK (15 & 20 MG) Take 15 mg by mouth in the morning and at bedtime for 21 days, THEN 20 mg daily. Follow package directions: Take one 15mg  tablet by mouth twice a day. On day 22, switch to one 20mg  tablet once a day. Take with food.. 05/05/23 06/25/23 Yes Rondel Baton, MD  acetaminophen (TYLENOL) 325 MG tablet Take 2 tablets (650 mg total) by mouth every 6 (six) hours as needed for mild pain or moderate pain. Patient not taking: Reported on 12/01/2022 11/29/22   Tawnya Crook, MD  amoxicillin-clavulanate (AUGMENTIN) 875-125 MG tablet Take 1 tablet by mouth every 12 (twelve) hours. 03/19/23   Blue, Soijett A, PA-C  feeding supplement (ENSURE ENLIVE / ENSURE PLUS) LIQD Take 237 mLs by mouth 2 (two) times daily between meals. Patient not taking: Reported on 12/01/2022 11/29/22   Tawnya Crook, MD      Allergies    Patient has no known allergies.    Review of Systems   Review of Systems  Physical Exam Updated Vital Signs BP 116/67   Pulse 71   Temp 98.6 F  (37 C) (Oral)   Resp 20   Wt 59.6 kg   SpO2 100%  Physical Exam Vitals and nursing note reviewed.  Constitutional:      General: He is not in acute distress.    Appearance: He is well-developed.  HENT:     Head: Normocephalic and atraumatic.     Right Ear: External ear normal.     Left Ear: External ear normal.     Nose: Nose normal.  Eyes:     Extraocular Movements: Extraocular movements intact.     Conjunctiva/sclera: Conjunctivae normal.     Pupils: Pupils are equal, round, and reactive to light.  Cardiovascular:     Rate and Rhythm: Normal rate and regular rhythm.     Heart sounds: Normal heart sounds.  Pulmonary:     Effort: Pulmonary effort is normal. No respiratory distress.     Breath sounds: Normal breath sounds.  Musculoskeletal:     Cervical back: Normal range of motion and neck supple.     Right lower leg: No edema.     Left lower leg: No edema.  Skin:    General: Skin is warm and dry.  Neurological:     Mental Status: He is alert. Mental status is at baseline.  Psychiatric:  Mood and Affect: Mood normal.        Behavior: Behavior normal.     ED Results / Procedures / Treatments   Labs (all labs ordered are listed, but only abnormal results are displayed) Labs Reviewed  BASIC METABOLIC PANEL - Abnormal; Notable for the following components:      Result Value   Potassium 3.4 (*)    All other components within normal limits  D-DIMER, QUANTITATIVE - Abnormal; Notable for the following components:   D-Dimer, Quant 0.72 (*)    All other components within normal limits  SARS CORONAVIRUS 2 BY RT PCR  CBC WITH DIFFERENTIAL/PLATELET  BRAIN NATRIURETIC PEPTIDE  TROPONIN I (HIGH SENSITIVITY)    EKG EKG Interpretation  Date/Time:  Friday May 05 2023 19:34:07 EDT Ventricular Rate:  82 PR Interval:  140 QRS Duration: 78 QT Interval:  350 QTC Calculation: 408 R Axis:   70 Text Interpretation: Normal sinus rhythm with sinus arrhythmia Normal ECG  When compared with ECG of 19-Mar-2023 09:43, T wave inversion no longer evident in Inferior leads Confirmed by Vonita Moss [phone]) on 05/05/2023 11:42:48 PM  Radiology DG Chest 2 View  Result Date: 05/05/2023 CLINICAL DATA:  Shortness of breath and chest pain. EXAM: CHEST - 2 VIEW COMPARISON:  CTA chest 03/19/2023 FINDINGS: The heart size and mediastinal contours are within normal limits. Both lungs are clear. The visualized skeletal structures are unremarkable apart from slight thoracic levoscoliosis. Compare: Ground-glass opacities noted previously are no longer seen. IMPRESSION: No evidence of acute chest disease. Electronically Signed   By: Almira Bar M.D.   On: 05/05/2023 20:47    Procedures Procedures    Medications Ordered in ED Medications  Rivaroxaban (XARELTO) tablet 15 mg (has no administration in time range)    Followed by  rivaroxaban (XARELTO) tablet 20 mg (has no administration in time range)    ED Course/ Medical Decision Making/ A&P Clinical Course as of 05/05/23 2344  Fri May 05, 2023  2050 Dr Pamelia Hoit from hematology consulted.  States that patient can be treated with a DOAC if he is able to afford it or Lovenox. [RP]  2120 Per pharmacy, patient is covered by Medicaid with ZERO copay of 30 DS Xareltop 20 mg. The last time picked up at [id number] - [place], [place] - [address] AT [facility] OF [address] on 01/08/2023. They ahve asked the [facility] pharmacist filled 30 DS and get it ready to him to pick up anytime.  [RP]    Clinical Course User Index [RP] Rondel Baton, MD                             Medical Decision Making Amount and/or Complexity of Data Reviewed Labs: ordered. Radiology: ordered.  Risk Prescription drug management.   Ricky Carroll is a 17 y.o. 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  Patient did recently have a CTA that did not show evidence of PE.  Given his age and would like to limit radiation to this patient since he is at  high risk of developing malignancy with repeated scans.  Will test him for signs of right heart strain chemically and send a D-dimer at this time.  Will also send a COVID test and obtain a chest x-ray to evaluate for any signs of infection which could be causing his symptoms.  Plan:  Labs D-dimer BNP COVID Chest x-ray EKG  ED Summary/Re-evaluation:  Patient reassessed and remained stable.  He remained on room air while in the emergency department and did not become tachycardic.  D-dimer was marginally elevated at 0.72 but his troponin was 2 and BNP was 5.  COVID was negative no signs of pneumonia on his chest x-ray.  Will treat him presumptively for PE at this time with therapeutic Lovenox.  Did arrange for his Xarelto to be sent to his pharmacy and he will start taking it tomorrow night.  They did call his pharmacy to confirm that he has a $0 co-pay for this medication.  Instructed him to follow-up with his primary doctor and hematologist to continue his workup.  This patient presents to the ED for concern of complaints listed in HPI, this involves an extensive number of treatment options, and is a complaint that carries with it a high risk of complications and morbidity. Disposition including potential need for admission considered.   Dispo: DC Home. Return precautions discussed including, but not limited to, those listed in the AVS. Allowed pt time to ask questions which were answered fully prior to dc.  Records reviewed Outpatient Clinic Notes The following labs were  independently interpreted: Chemistry and show no acute abnormality I independently reviewed the following imaging with scope of interpretation limited to determining acute life threatening conditions related to emergency care: Chest x-ray and agree with the radiologist interpretation with the following exceptions: none I personally reviewed and interpreted cardiac monitoring: normal sinus rhythm  I personally reviewed and interpreted the pt's EKG: see above for interpretation  I have reviewed the patients home medications and made adjustments as needed Consults:  hematology        Final Clinical Impression(s) / ED Diagnoses Final diagnoses:  History of pulmonary embolism  Shortness of breath    Rx / DC Orders ED Discharge Orders          Ordered    RIVAROXABAN (XARELTO) VTE STARTER PACK (15 & 20 MG)  Multiple Frequencies        05/05/23 2156              Rondel Baton, MD 05/05/23 2344

## 2023-05-05 NOTE — ED Triage Notes (Signed)
Pt presents to ED POV. Pt c/o SOB x2w. Pt reports that he was diagnosed with PE in March but was unable to get blood thinners d/t insurance. Pt reports bilateral LE pain. Pt hx of PE and DTV.

## 2023-05-05 NOTE — ED Notes (Signed)
Patient back from x-ray 

## 2023-05-05 NOTE — Discharge Instructions (Addendum)
Please pick up your Xarelto and start taking tomorrow night.  It will be an increased dose for the first 3 weeks which will be twice a day.  It will then become once a day afterwards.  Follow-up with your hematologist in 1 week.  Return if you start experiencing worsening chest pain, shortness of breath, fainting, or any other concerning symptoms.  Information on my medicine - XARELTO (rivaroxaban)  This medication education was reviewed with me or my healthcare representative as part of my discharge preparation.  WHY WAS XARELTO PRESCRIBED FOR YOU? Xarelto was prescribed to treat blood clots that may have been found in the veins of your legs (deep vein thrombosis) or in your lungs (pulmonary embolism) and to reduce the risk of them occurring again.  What do you need to know about Xarelto? The starting dose is one 15 mg tablet taken TWICE daily with food for the FIRST 21 DAYS then on 05/27/23 PM, the dose is changed to one 20 mg tablet taken ONCE A DAY with your evening meal.  DO NOT stop taking Xarelto without talking to the health care provider who prescribed the medication.  Refill your prescription for 20 mg tablets before you run out.  After discharge, you should have regular check-up appointments with your healthcare provider that is prescribing your Xarelto.  In the future your dose may need to be changed if your kidney function changes by a significant amount.  What do you do if you miss a dose? If you are taking Xarelto TWICE DAILY and you miss a dose, take it as soon as you remember. You may take two 15 mg tablets (total 30 mg) at the same time then resume your regularly scheduled 15 mg twice daily the next day.  If you are taking Xarelto ONCE DAILY and you miss a dose, take it as soon as you remember on the same day then continue your regularly scheduled once daily regimen the next day. Do not take two doses of Xarelto at the same time.   Important Safety  Information Xarelto is a blood thinner medicine that can cause bleeding. You should call your healthcare provider right away if you experience any of the following: Bleeding from an injury or your nose that does not stop. Unusual colored urine (red or dark brown) or unusual colored stools (red or black). Unusual bruising for unknown reasons. A serious fall or if you hit your head (even if there is no bleeding).  Some medicines may interact with Xarelto and might increase your risk of bleeding while on Xarelto. To help avoid this, consult your healthcare provider or pharmacist prior to using any new prescription or non-prescription medications, including herbals, vitamins, non-steroidal anti-inflammatory drugs (NSAIDs) and supplements.  This website has more information on Xarelto: [email or web address].br.

## 2023-06-18 IMAGING — CT CT ANGIO CHEST
2 of 6 series · 18 of 46 positions shown · IV contrast (agent unspecified)
Comparison: None.

CLINICAL DATA: Positive D-dimer with weakness, initial encounter

EXAM:
CT ANGIOGRAPHY CHEST WITH CONTRAST
TECHNIQUE: Multidetector CT imaging of the chest was performed using the
standard protocol during bolus administration of intravenous
contrast. Multiplanar CT image reconstructions and MIPs were
obtained to evaluate the vascular anatomy.

[Series 6: thins · axial · 0.72mm/px · z∈[+1039,+1284]mm · 15 of 269 slices shown]
[im 12/269  lung]
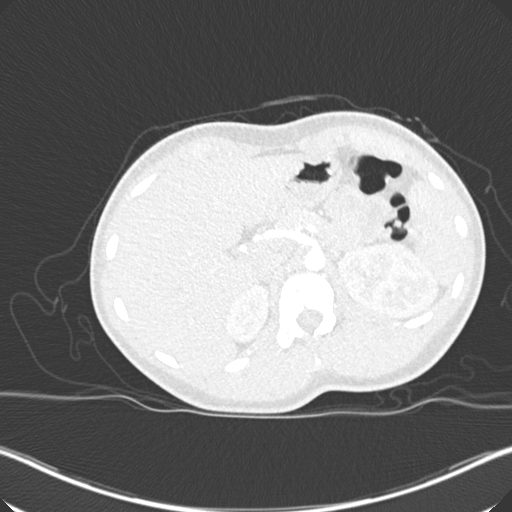
[im 35/269  soft-tissue]
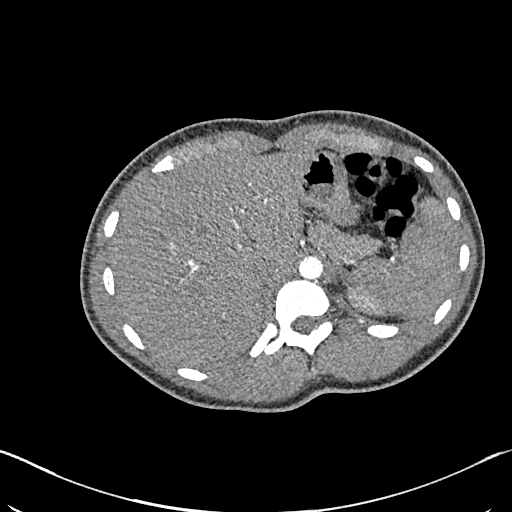
[im 47/269  lung]
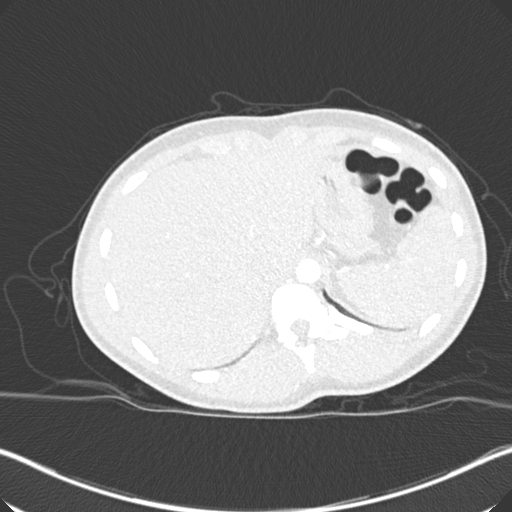
[im 70/269  soft-tissue]
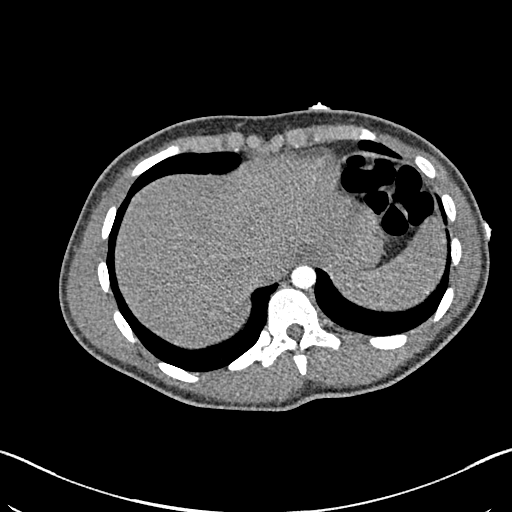
[im 82/269  lung]
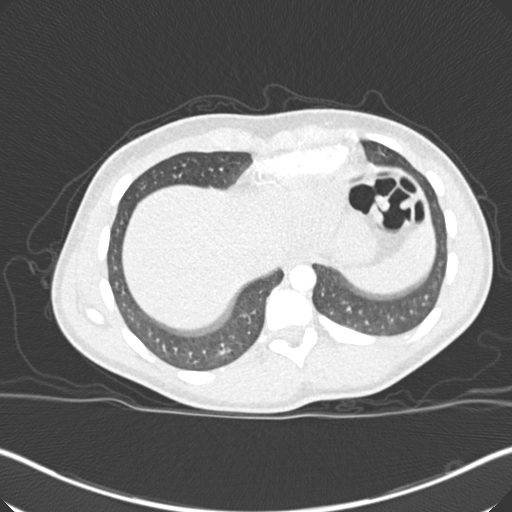
[im 105/269  soft-tissue]
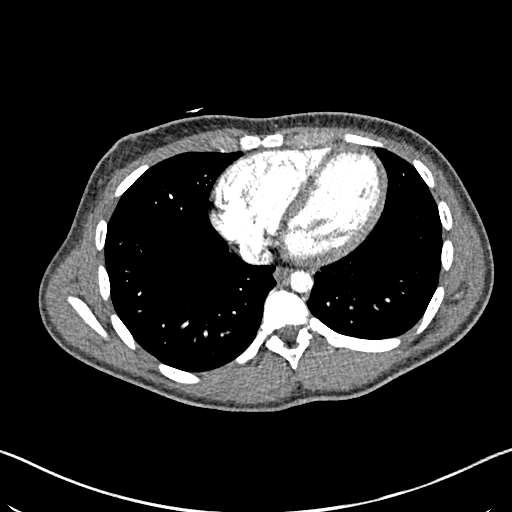
[im 117/269  lung]
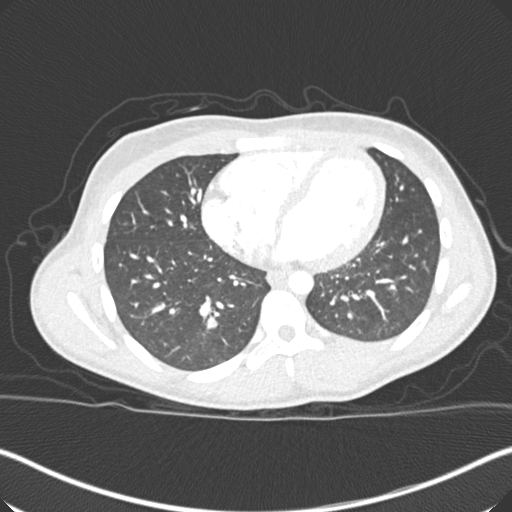
[im 140/269  soft-tissue]
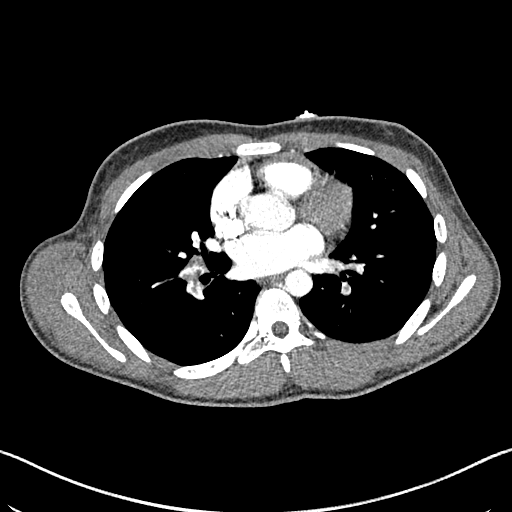
[im 152/269  lung]
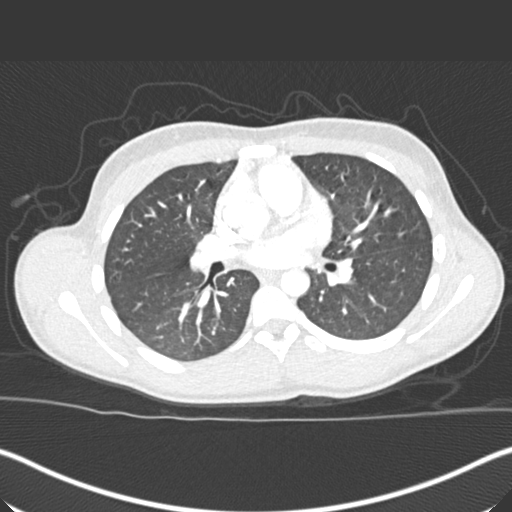
[im 164/269  soft-tissue]
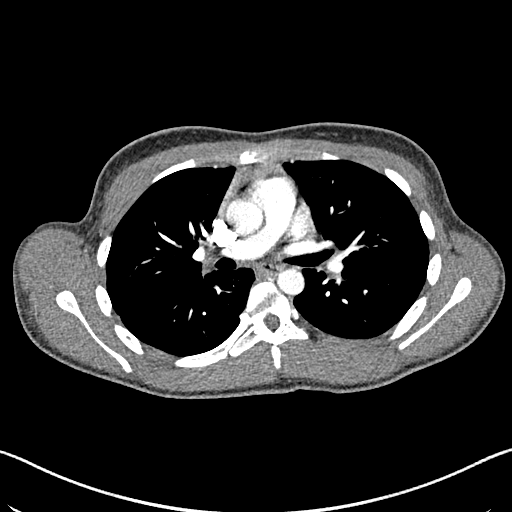
[im 187/269  lung]
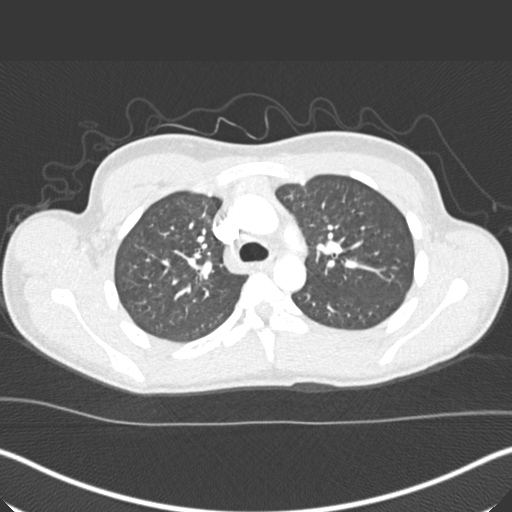
[im 199/269  soft-tissue]
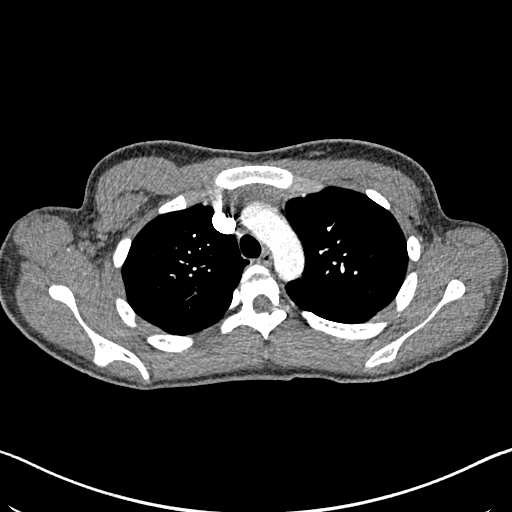
[im 222/269  lung]
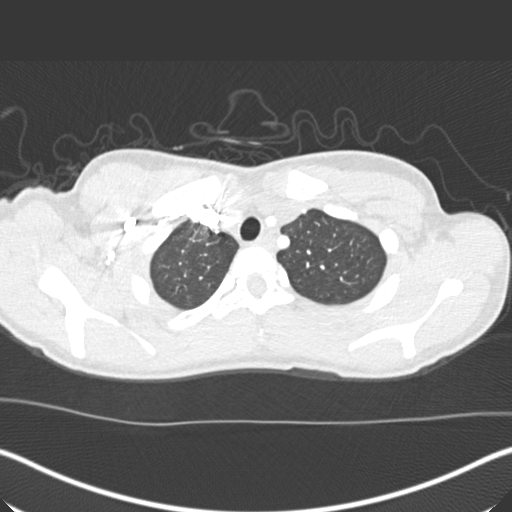
[im 234/269  soft-tissue]
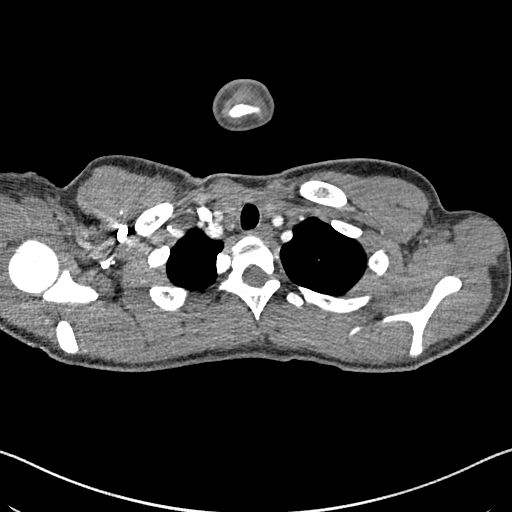
[im 257/269  lung]
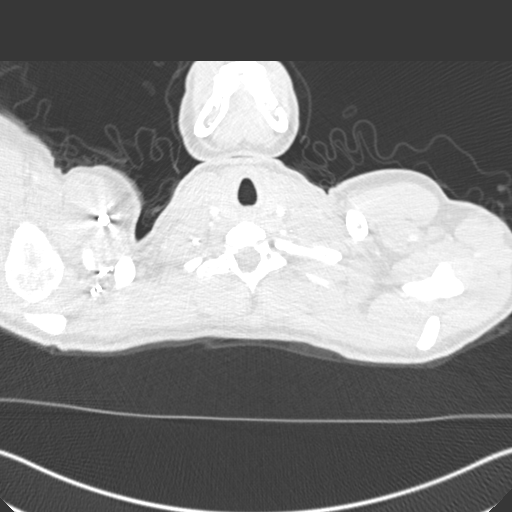

[Series 8: coronal mpr · coronal · 0.53mm/px · 3 of 106 slices shown]
[im 27/106  soft-tissue]
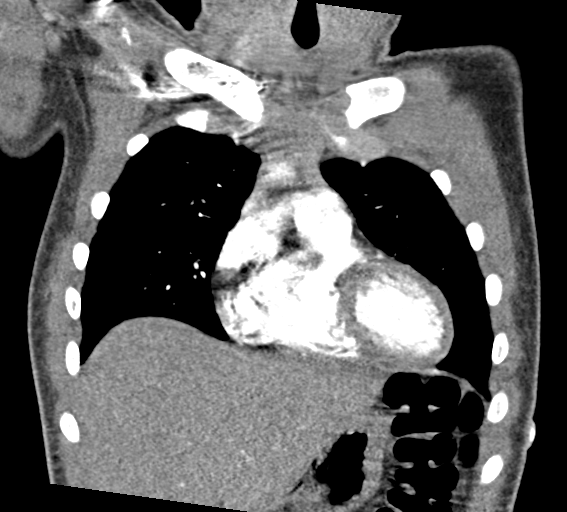
[im 53/106  soft-tissue]
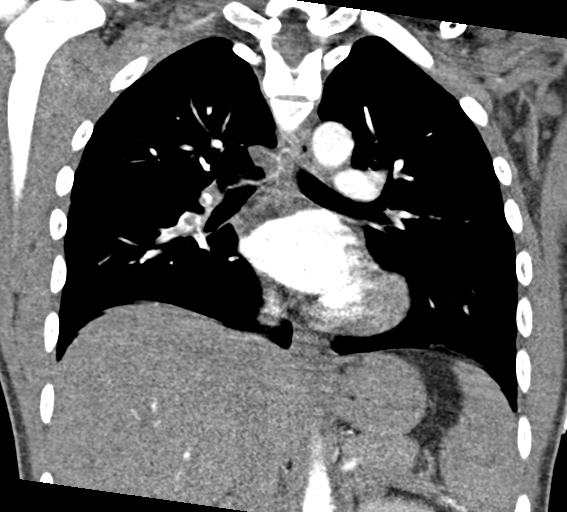
[im 79/106  soft-tissue]
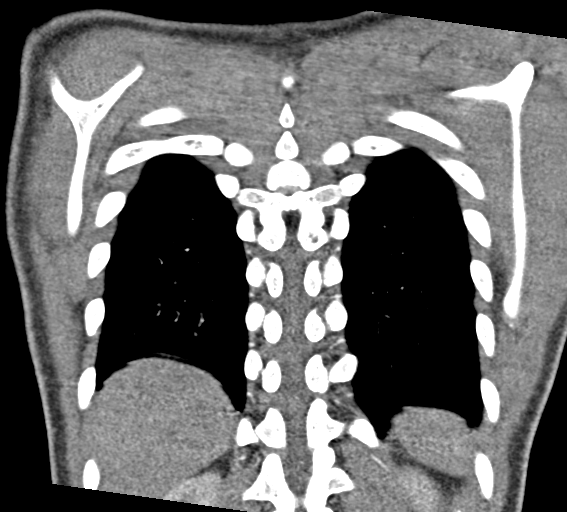

[18 of 46 positions shown; findings below may reference images not displayed]

RADIATION DOSE REDUCTION: This exam was performed according to the
departmental dose-optimization program which includes automated
exposure control, adjustment of the mA and/or kV according to
patient size and/or use of iterative reconstruction technique.

CONTRAST:  55mL OMNIPAQUE IOHEXOL 350 MG/ML SOLN
FINDINGS: Cardiovascular: Thoracic aorta and its branches are within normal
limits. No aneurysmal dilatation or dissection is noted. Pulmonary
artery shows a normal branching pattern bilaterally. Filling defects
are identified throughout the pulmonary arterial branches slightly
greater on the right than the left. Mild elevation of the RV/LV
ratio is noted at 0.99.

Mediastinum/Nodes: Thoracic inlet is within normal limits. No
sizable hilar or mediastinal adenopathy is noted. The esophagus is
within normal limits.

Lungs/Pleura: Lungs are well aerated bilaterally. No focal
infiltrate or sizable effusion is noted.

Upper Abdomen: Visualized upper abdomen is within normal limits.

Musculoskeletal: No chest wall abnormality. No acute or significant
osseous findings.

Review of the MIP images confirms the above findings.
IMPRESSION: Bilateral pulmonary emboli right greater than left with evidence of
mild right heart strain.

Critical Value/emergent results were called by telephone at the time
of interpretation on 01/17/2022 at [DATE] to NEIRY PIRUKA, NP , who
verbally acknowledged these results.

## 2023-08-28 ENCOUNTER — Encounter (HOSPITAL_COMMUNITY): Payer: Self-pay | Admitting: *Deleted

## 2023-08-28 ENCOUNTER — Encounter (HOSPITAL_COMMUNITY): Payer: Self-pay

## 2023-08-28 ENCOUNTER — Other Ambulatory Visit: Payer: Self-pay

## 2023-08-28 ENCOUNTER — Emergency Department (HOSPITAL_COMMUNITY)
Admission: EM | Admit: 2023-08-28 | Discharge: 2023-08-28 | Disposition: A | Discharge status: Home / Self Care | Payer: Medicaid Other | Attending: Emergency Medicine | Admitting: Emergency Medicine

## 2023-08-28 ENCOUNTER — Ambulatory Visit (HOSPITAL_COMMUNITY)
Admission: EM | Admit: 2023-08-28 | Discharge: 2023-08-28 | Disposition: A | Discharge status: Home / Self Care | Payer: Medicaid Other

## 2023-08-28 DIAGNOSIS — S71111A Laceration without foreign body, right thigh, initial encounter: Secondary | ICD-10-CM

## 2023-08-28 DIAGNOSIS — Z7901 Long term (current) use of anticoagulants: Secondary | ICD-10-CM | POA: Insufficient documentation

## 2023-08-28 DIAGNOSIS — W268XXA Contact with other sharp object(s), not elsewhere classified, initial encounter: Secondary | ICD-10-CM | POA: Diagnosis not present

## 2023-08-28 DIAGNOSIS — S71141A Puncture wound with foreign body, right thigh, initial encounter: Secondary | ICD-10-CM | POA: Insufficient documentation
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
## 2023-08-28 NOTE — Discharge Instructions (Addendum)
Thank you for allowing [place] to be a part of your care today.  You were evaluated in the ED for a wound to your RUE.  Please keep your wound clean and dry.  I recommend applying Neosporin or Bacitracin ointment to the wound after cleaning it during your shower.    Monitor for signs of infection including redness, worsening swelling, increased warmth, or drainage from the wound site.  If any of these signs or symptoms develop, seek immediate medical attention.   Take [phone] mg of Tylenol every 6-8 hours as needed for pain.  You may also apply ice 2-3 times per day for no longer than 20 minutes at a time.   Return to the ED if you develop sudden worsening of your symptoms or if you have any new concerns.

## 2023-08-28 NOTE — Discharge Instructions (Signed)
Please proceed directly to the emergency room for further evaluation and management of the laceration to your thigh

## 2023-09-03 ENCOUNTER — Ambulatory Visit (HOSPITAL_COMMUNITY)
Admission: EM | Admit: 2023-09-03 | Discharge: 2023-09-03 | Disposition: A | Discharge status: Home / Self Care | Payer: Medicaid Other | Attending: Emergency Medicine | Admitting: Emergency Medicine

## 2023-09-03 ENCOUNTER — Encounter (HOSPITAL_COMMUNITY): Payer: Self-pay

## 2023-09-03 DIAGNOSIS — Z1152 Encounter for screening for COVID-19: Secondary | ICD-10-CM | POA: Diagnosis not present

## 2023-09-03 DIAGNOSIS — B9789 Other viral agents as the cause of diseases classified elsewhere: Secondary | ICD-10-CM | POA: Insufficient documentation

## 2023-09-03 DIAGNOSIS — R051 Acute cough: Secondary | ICD-10-CM | POA: Diagnosis present

## 2023-09-03 DIAGNOSIS — J029 Acute pharyngitis, unspecified: Secondary | ICD-10-CM | POA: Insufficient documentation

## 2023-09-03 DIAGNOSIS — J988 Other specified respiratory disorders: Secondary | ICD-10-CM | POA: Insufficient documentation

## 2023-09-03 LAB — POCT RAPID STREP A (OFFICE): Rapid Strep A Screen: NEGATIVE

## 2023-09-03 MED ORDER — BENZONATATE 100 MG PO CAPS: 100.0000 mg | ORAL_CAPSULE | Freq: Three times a day (TID) | ORAL | 0 refills | Status: DC

## 2023-09-03 MED ORDER — PROMETHAZINE-DM 6.25-15 MG/5ML PO SYRP: 5.0000 mL | ORAL_SOLUTION | Freq: Every evening | ORAL | 0 refills | Status: DC | PRN

## 2023-09-03 NOTE — ED Provider Notes (Signed)
[facility]    CSN: [id number] Arrival date & time: 09/03/23  1309      History   Chief Complaint Chief Complaint  Patient presents with   Sore Throat   Cough    HPI Ricky Carroll is a 17 y.o. male.   Patient presents with sore throat with swelling, congestion, and dry cough x 2 days.  Denies fever, shortness of breath, abdominal pain, nausea, vomiting, and diarrhea.  Patient states his girlfriend was sick last week. Patient states he took Tylenol and cough medication with no relief.   Sore Throat Pertinent negatives include no abdominal pain, no headaches and no shortness of breath.  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 Authorizing Provider  acetaminophen (TYLENOL) 325 MG tablet Take 2 tablets (650 mg total) by mouth every 6 (six) hours as needed for mild pain or moderate pain. 11/29/22  Yes Tawnya Crook, MD  benzonatate (TESSALON) 100 MG  capsule Take 1 capsule (100 mg total) by mouth every 8 (eight) hours. 09/03/23  Yes Wynonia Lawman A, NP  promethazine-dextromethorphan (PROMETHAZINE-DM) 6.25-15 MG/5ML syrup Take 5 mLs by mouth at bedtime as needed for cough. 09/03/23  Yes Wynonia Lawman A, NP  RIVAROXABAN Carlena Hurl) VTE STARTER PACK (15 & 20 MG) Take 15 mg by mouth in the morning and at bedtime for 21 days, THEN 20 mg daily. Follow package directions: Take one 15mg  tablet by mouth twice a day. On day 22, switch to one 20mg  tablet once a day. Take with food.. 05/05/23 09/03/23 Yes Rondel Baton, MD    Family History Family History  Problem Relation Age of Onset   Migraines Mother     Social History Social History   Tobacco Use   Smoking status: Some Days    Types: Cigars    Passive exposure: Yes   Smokeless tobacco: Never  Substance Use Topics   Alcohol use: No   Drug use: No     Allergies   Patient has no known allergies.   Review of Systems Review of Systems  Constitutional:  Negative for chills, fatigue and fever.  HENT:  Positive for congestion and sore throat. Negative for sinus pressure, sinus pain and trouble swallowing.   Respiratory:  Positive for cough. Negative for chest tightness and shortness of breath.   Gastrointestinal:  Negative for abdominal pain, diarrhea, nausea and vomiting.  Skin:  Negative for color change.  Neurological:  Negative for weakness, light-headedness and headaches.     Physical Exam Triage Vital Signs ED Triage Vitals  Encounter Vitals Group     BP 09/03/23 1416 116/73     Systolic BP Percentile --      Diastolic BP Percentile --      Pulse Rate 09/03/23 1416 65     Resp 09/03/23 1416 18     Temp 09/03/23 1416 98.2 F (36.8 C)     Temp Source 09/03/23 1416 Oral     SpO2 09/03/23 1416 98 %     Weight 09/03/23 1416 145 lb (65.8 kg)     Height 09/03/23 1416 5\' 9"  (1.753 m)     Head Circumference --      Peak Flow --      Pain Score 09/03/23 1414 9     Pain  Loc --      Pain Education --      Exclude from Growth Chart --    No data found.  Updated Vital Signs BP 116/73 (BP Location: Right Arm)   Pulse 65   Temp 98.2 F (36.8 C) (Oral)   Resp 18   Ht 5\' 9"  (1.753 m)   Wt 145 lb (65.8 kg)   SpO2 98%   BMI 21.41 kg/m   Visual Acuity Right Eye Distance:   Left Eye Distance:   Bilateral Distance:    Right Eye Near:   Left Eye Near:    Bilateral Near:     Physical Exam Vitals and nursing note reviewed.  Constitutional:      General: He is awake.     Appearance: Normal appearance. He is well-developed and well-groomed.  HENT:     Right Ear: Tympanic membrane and ear canal normal.     Left Ear: Tympanic membrane and ear canal normal.     Nose: Congestion and rhinorrhea present.     Mouth/Throat:     Mouth: Mucous membranes are moist.     Pharynx: Pharyngeal swelling, posterior oropharyngeal erythema and postnasal drip present. No oropharyngeal exudate.     Tonsils: No tonsillar exudate.  Cardiovascular:     Rate and Rhythm: Normal rate.     Heart sounds: Normal heart sounds.  Pulmonary:     Effort: Pulmonary effort is normal. No respiratory distress.     Breath sounds: Normal breath sounds. No wheezing.  Musculoskeletal:     Cervical back: Normal range of motion.  Skin:    General: Skin is warm and dry.  Neurological:     Mental Status: He is alert.  Psychiatric:        Behavior: Behavior is cooperative.      UC Treatments / Results  Labs (all labs ordered are listed, but only abnormal results are displayed) Labs Reviewed  CULTURE, GROUP A STREP ([facility])  SARS CORONAVIRUS 2 (TAT 6-24 HRS)  POCT RAPID STREP A (OFFICE)    EKG   Radiology No results found.  Procedures Procedures (including critical care time)  Medications Ordered in UC Medications - No data to display  Initial Impression / Assessment and Plan / UC Course  I have reviewed the triage vital signs and the nursing notes.  Pertinent labs &  imaging results that were available during my care of the patient were reviewed by me and considered in my medical decision making (see chart for details).     Patient presented with 2-day history of with sore throat with swelling, congestion, and dry cough.  Denies fever,  shortness of breath, abdominal pain, nausea, vomiting, and diarrhea.  Patient states his girlfriend was sick last week.  Patient states he took Tylenol and cough medication with no relief.  Upon assessment patient is mild erythema and edema to throat.  Lungs clear bilaterally on auscultation.  Rapid strep test was negative and will send culture.  COVID testing ordered.  Prescribed Tessalon and promethazine for cough.  Recommended over-the-counter medication for other symptoms.  Discussed return precautions. Final Clinical Impressions(s) / UC Diagnoses   Final diagnoses:  Viral respiratory illness  Acute cough  Sore throat     Discharge Instructions      You can take Tessalon during the day for cough and promethazine cough syrup at night as needed. Otherwise alternate between Tylenol and ibuprofen for pain and fever. You can also get some over-the-counter Mucinex to take during the day for congestion.  Your results will come back over the next few days and someone will call you if treatment plan needs to be adjusted.  Return here as needed.     ED Prescriptions     Medication Sig Dispense Auth. Provider   promethazine-dextromethorphan (PROMETHAZINE-DM) 6.25-15 MG/5ML syrup Take 5 mLs by mouth at bedtime as needed for cough. 118 mL Susann Givens, Travontae Freiberger A, NP   benzonatate (TESSALON) 100 MG capsule Take 1 capsule (100 mg total) by mouth every 8 (eight) hours. 21 capsule Wynonia Lawman A, NP      PDMP not reviewed this encounter.   Wynonia Lawman A, NP 09/03/23 1441

## 2023-09-03 NOTE — ED Triage Notes (Signed)
Sore throat with swelling onset 1-2 days. No fever or runny nose. Slight dry cough. States last week his girlfriend and her daughter were sick but no testing done.   Patient tried tylenol and cough medication otc with no relief.

## 2023-09-03 NOTE — Discharge Instructions (Signed)
You can take Tessalon during the day for cough and promethazine cough syrup at night as needed. Otherwise alternate between Tylenol and ibuprofen for pain and fever. You can also get some over-the-counter Mucinex to take during the day for congestion.  Your results will come back over the next few days and someone will call you if treatment plan needs to be adjusted.  Return here as needed.

## 2023-09-04 LAB — SARS CORONAVIRUS 2 (TAT 6-24 HRS): SARS Coronavirus 2: NEGATIVE

## 2023-09-06 LAB — CULTURE, GROUP A STREP (THRC)

## 2023-09-07 ENCOUNTER — Emergency Department (HOSPITAL_COMMUNITY)
Admission: EM | Admit: 2023-09-07 | Discharge: 2023-09-07 | Discharge status: Against Medical Advice | Payer: Medicaid Other | Attending: Emergency Medicine | Admitting: Emergency Medicine

## 2023-09-07 ENCOUNTER — Encounter (HOSPITAL_COMMUNITY): Payer: Self-pay

## 2023-09-07 ENCOUNTER — Other Ambulatory Visit: Payer: Self-pay

## 2023-09-07 DIAGNOSIS — Z5321 Procedure and treatment not carried out due to patient leaving prior to being seen by health care provider: Secondary | ICD-10-CM | POA: Diagnosis not present

## 2023-09-07 DIAGNOSIS — Y92410 Unspecified street and highway as the place of occurrence of the external cause: Secondary | ICD-10-CM | POA: Insufficient documentation

## 2023-09-07 DIAGNOSIS — M546 Pain in thoracic spine: Secondary | ICD-10-CM | POA: Insufficient documentation

## 2023-09-07 DIAGNOSIS — M79605 Pain in left leg: Secondary | ICD-10-CM | POA: Diagnosis not present

## 2023-09-07 DIAGNOSIS — M79632 Pain in left forearm: Secondary | ICD-10-CM | POA: Insufficient documentation
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
## 2024-06-03 ENCOUNTER — Other Ambulatory Visit: Payer: Self-pay

## 2024-06-03 ENCOUNTER — Emergency Department (HOSPITAL_COMMUNITY)

## 2024-06-03 ENCOUNTER — Emergency Department (HOSPITAL_COMMUNITY)
Admission: EM | Admit: 2024-06-03 | Discharge: 2024-06-03 | Disposition: A | Discharge status: Home / Self Care | Attending: Emergency Medicine | Admitting: Emergency Medicine

## 2024-06-03 ENCOUNTER — Encounter (HOSPITAL_COMMUNITY): Payer: Self-pay

## 2024-06-03 DIAGNOSIS — I82503 Chronic embolism and thrombosis of unspecified deep veins of lower extremity, bilateral: Secondary | ICD-10-CM | POA: Insufficient documentation

## 2024-06-03 DIAGNOSIS — R2243 Localized swelling, mass and lump, lower limb, bilateral: Secondary | ICD-10-CM | POA: Diagnosis present

## 2024-06-03 DIAGNOSIS — Z7901 Long term (current) use of anticoagulants: Secondary | ICD-10-CM | POA: Diagnosis not present

## 2024-06-03 DIAGNOSIS — O2230 Deep phlebothrombosis in pregnancy, unspecified trimester: Secondary | ICD-10-CM

## 2024-06-03 LAB — BASIC METABOLIC PANEL WITH GFR
Anion gap: 8 (ref 5–15)
BUN: 12 mg/dL (ref 6–20)
CO2: 25 mmol/L (ref 22–32)
Calcium: 9.3 mg/dL (ref 8.9–10.3)
Chloride: 106 mmol/L (ref 98–111)
Creatinine, Ser: 0.65 mg/dL (ref 0.61–1.24)
GFR, Estimated: >60 mL/min (ref >60)
Glucose, Bld: 85 mg/dL (ref 70–99)
Potassium: 3.8 mmol/L (ref 3.5–5.1)
Sodium: 139 mmol/L (ref 135–145)

## 2024-06-03 LAB — CBC WITH DIFFERENTIAL/PLATELET
Abs Immature Granulocytes: 0.02 10*3/uL (ref 0.00–0.07)
Basophils Absolute: 0 10*3/uL (ref 0.0–0.1)
Basophils Relative: 1 %
Eosinophils Absolute: 0.4 10*3/uL (ref 0.0–0.5)
Eosinophils Relative: 8 %
HCT: 46.5 % (ref 39.0–52.0)
Hemoglobin: 15.8 g/dL (ref 13.0–17.0)
Immature Granulocytes: 0 %
Lymphocytes Relative: 37 %
Lymphs Abs: 1.7 10*3/uL (ref 0.7–4.0)
MCH: 30.2 pg (ref 26.0–34.0)
MCHC: 34 g/dL (ref 30.0–36.0)
MCV: 88.9 fL (ref 80.0–100.0)
Monocytes Absolute: 0.5 10*3/uL (ref 0.1–1.0)
Monocytes Relative: 11 %
Neutro Abs: 2.1 10*3/uL (ref 1.7–7.7)
Neutrophils Relative %: 43 %
Platelets: 199 10*3/uL (ref 150–400)
RBC: 5.23 MIL/uL (ref 4.22–5.81)
RDW: 14.7 % (ref 11.5–15.5)
WBC: 4.7 10*3/uL (ref 4.0–10.5)
nRBC: 0 % (ref 0.0–0.2)

## 2024-06-03 MED ORDER — RIVAROXABAN (XARELTO) VTE STARTER PACK (15 & 20 MG): ORAL_TABLET | ORAL | 0 refills | Status: DC

## 2024-06-03 MED ORDER — RIVAROXABAN 15 MG PO TABS
15.0000 mg | ORAL_TABLET | Freq: Once | ORAL | Status: AC
  Administered 2024-06-03: 15 mg via ORAL
  Filled 2024-06-03: qty 1

## 2024-06-03 NOTE — Discharge Instructions (Addendum)
 Continue taking your xaralto as prescribed.  You are being referred to the DVT clinic in [facility] for close follow up care.

## 2024-06-03 NOTE — ED Triage Notes (Signed)
 Pt arrived via POV c/o bilateral lower extremity swelling and pain X 2 days. Pt reports Hx of DVT.

## 2024-06-03 NOTE — ED Provider Notes (Signed)
 [place] EMERGENCY DEPARTMENT AT [facility] Provider Note   CSN: [id number] Arrival date & time: 06/03/24  7829     Patient presents with: Leg Swelling   Ricky Carroll is a 18 y.o. male with a history significant for DVT and PE, who is no longer on anticoagulation times several months presenting for bilateral ankle edema but pain and tightness only in his right calf which he first noticed 2 days ago.  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  He denies chest pain, shortness of breath, cough, fever or any other complaint.   HPI     Prior to Admission medications   Medication Sig Start Date End Date Taking? Authorizing Provider  RIVAROXABAN  (XARELTO ) VTE STARTER PACK (15 & 20 MG) Follow package directions: Take one 15mg  tablet by mouth twice a day. On day 22, switch to one 20mg  tablet once a day. Take with food. 06/03/24  Yes Zollie Ellery, PA-C  acetaminophen  (TYLENOL ) 325 MG tablet Take 2 tablets (650 mg total) by mouth every 6 (six) hours as needed for mild pain or moderate pain. 11/29/22   Currie Douse, MD  benzonatate  (TESSALON ) 100 MG capsule Take 1 capsule (100 mg total) by mouth every 8 (eight) hours. 09/03/23   Levora Reas A, NP  promethazine -dextromethorphan (PROMETHAZINE -DM) 6.25-15 MG/5ML syrup Take 5 mLs by mouth at bedtime as needed for cough. 09/03/23   Karon Packer, NP    Allergies: Patient has no known allergies.    Review of Systems  Constitutional:  Negative for chills and fever.  HENT:  Negative for congestion and sore throat.   Eyes: Negative.   Respiratory:  Negative for cough, chest tightness and shortness of breath.   Cardiovascular:  Positive for leg swelling. Negative for chest pain.  Gastrointestinal:  Negative for abdominal pain and nausea.  Genitourinary: Negative.   Musculoskeletal:  Negative for arthralgias, joint swelling and neck pain.  Skin: Negative.   Negative for rash and wound.  Neurological:  Negative for dizziness, weakness, light-headedness, numbness and headaches.  Psychiatric/Behavioral: Negative.      Updated Vital Signs BP 123/81 (BP Location: Right Arm)   Pulse 62   Temp 97.8 F (36.6 C) (Oral)   Resp 19   Ht 5' 9 (1.753 m)   Wt 63.5 kg   SpO2 100%   BMI 20.67 kg/m   Physical Exam Vitals and nursing note reviewed.  Constitutional:      Appearance: He is well-developed.  HENT:     Head: Normocephalic and atraumatic.   Eyes:     Conjunctiva/sclera: Conjunctivae normal.    Cardiovascular:     Rate and Rhythm: Normal rate and regular rhythm.     Heart sounds: Normal heart sounds.  Pulmonary:     Effort: Pulmonary effort is normal.     Breath sounds: Normal breath sounds. No wheezing.  Abdominal:     General: Bowel sounds are normal.   Musculoskeletal:        General: Swelling and tenderness present. Normal range of motion.     Cervical back: Normal range of motion.     Comments: Bilateral foot and ankle edema, right greater than left.  His left calf is nontender and soft with negative Homans' sign.  Right calf is tender, Homans positive.   Skin:    General: Skin is warm and dry.   Neurological:     Mental  Status: He is alert.     (all labs ordered are listed, but only abnormal results are displayed) Labs Reviewed  CBC WITH DIFFERENTIAL/PLATELET  BASIC METABOLIC PANEL WITH GFR    EKG: None  Radiology: US  Venous Img Lower Bilateral Result Date: 06/03/2024 CLINICAL DATA:  Right calf pain with bilateral swelling. History of DVT. Patient not currently on anticoagulation. History of tobacco use. History of prior PE. EXAM: Bilateral LOWER EXTREMITY VENOUS DOPPLER ULTRASOUND TECHNIQUE: Gray-scale sonography with compression, as well as color and duplex ultrasound, were performed to evaluate the deep venous system(s) from the level of the common femoral vein through the popliteal and proximal calf veins.  COMPARISON:  Bilateral FINDINGS: VENOUS Normal compressibility of the common femoral, superficial femoral, and popliteal veins, as well as the visualized calf veins in the left lower extremity. Visualized portions of profunda femoral vein and great saphenous vein unremarkable in the left lower extremity. No filling defects to suggest DVT on grayscale or color Doppler imaging in the left lower extremity. Doppler waveforms show normal direction of venous flow, normal respiratory plasticity and response to augmentation in the left lower extremity. In the right lower extremity there is normal compressibility of the common femoral vein, however the superficial femoral vein and popliteal veins demonstrate filling defect into cues flow consistent with acute DVT. The femoral vein DVT is nonocclusive, however the popliteal vein and gastrocnemius vein appear to have acute occlusive thrombus. OTHER None. Limitations: none IMPRESSION: Positive acute DVT involving the femoral vein, popliteal vein, and gastrocnemius vein in the right lower extremity. Negative for DVT in the left lower extremity. These findings called to the ER physician at [facility]. Electronically Signed   By: Susan Ensign   On: 06/03/2024 12:46     Procedures   Medications Ordered in the ED  Rivaroxaban  (XARELTO ) tablet 15 mg (15 mg Oral Given 06/03/24 1346)                                    Medical Decision Making Patient with a history of DVT and PE presenting with return of his bilateral lower extremity edema but pain is localized to his right calf only.  This is concerning for recurrence of DVT, he denies any injury, he has been more active as he started working with a [employer] representative company 3 weeks ago.  Denies shortness of breath, denies chest pain, his vital signs are stable, there is no indication for needing PE workup.  Certainly concerning for DVT recurrence.  His basic lab work is unremarkable, ultrasound imaging does  confirm a right lower extremity DVT not involving the common femoral but involving the superficial femoral, popliteal and gastrocnemius vein.  Common femoral only is partially occluded.  Return precautions outlined.  Amount and/or Complexity of Data Reviewed External Data Reviewed: labs and notes. Labs: ordered.    Details: B med and CBC negative. Radiology: ordered.    Details: Details per above. Discussion of management or test interpretation with external provider(s): Patient discussed with Dr. Fulton Job vascular service, no indication for acute intervention today, advised referral to DVT clinic, patient was also started on Xarelto  with first dose given here and Xarelto  starter pack prescribed.  Return precautions were outlined.  Risk Prescription drug management.        Final diagnoses:  DVT (deep vein thrombosis) in pregnancy    ED Discharge Orders  Ordered    RIVAROXABAN  (XARELTO ) VTE STARTER PACK (15 & 20 MG)        06/03/24 1326    AMB Referral to Deep Vein Thrombosis Clinic        06/03/24 1333               Alyse July 06/03/24 1642    Rosealee Concha, MD 06/03/24 [phone]

## 2024-06-05 NOTE — Progress Notes (Deleted)
 DVT Clinic Note  Name: Ricky Carroll     MRN: [id number]     DOB: 2006-07-28     Sex: male  PCP: Lavada Porteous, DO  Today's Visit:    Referred to DVT Clinic by: Emergency Department - Katherine Pancake, PA-C Referred to CPP by: {CPP Referral Provider:[id number]} Reason for referral: No chief complaint on file.  HISTORY OF PRESENT ILLNESS: Ricky Carroll is a 18 y.o. male with PMH multiple DVT/PE who presents after diagnosis of DVT for medication management. Patient has previously been established with [facility] hematology with Dr. Veneta Gilding for recurrent PE, last seen 12/30/22. 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 He discontinued anticoagulation after only about two months after the initial PE due to no showing follow up. The plan after the second PE was lifelong anticoagulation but patient neglected to follow up with hematology as well and ultimately stopped taking Xarelto  after a couple months again. He was recently seen in the ED 06/03/24 with pain and tightness in the right calf. Ultrasound showed acute right femoropopliteal DVT. ED provider spoke with Dr. Fulton Job who recommended no acute vascular intervention, restart DOAC, and follow up in DVT Clinic.   Today, patient reports ***. *** abnormal bleeding or bruising. *** missed doses of ***. *** wearing compression stockings.            Rx Insurance Coverage: Medicaid Rx Affordability: *** Rx Assistance Provided: {Rx Assistance Provided:[id number]} Preferred Pharmacy: ***  Past Medical History:  Diagnosis Date   DVT (deep venous thrombosis) (HCC)     Past Surgical History:  Procedure Laterality Date   TONSILLECTOMY     TONSILLECTOMY     TONSILLECTOMY AND ADENOIDECTOMY Bilateral     Social History   Socioeconomic History   Marital status: Single    Spouse name: Not on file   Number of children: Not on file   Years of education: Not on file   Highest education level: Not on  file  Occupational History   Not on file  Tobacco Use   Smoking status: Some Days    Types: Cigars    Passive exposure: Yes   Smokeless tobacco: Never  Vaping Use   Vaping status: Some Days  Substance and Sexual Activity   Alcohol use: No   Drug use: No   Sexual activity: Yes  Other Topics Concern   Not on file  Social History Narrative   Armonie is in fifth grade at [employer]. He is doing well.   Lives with his mother and older brother. Mother is expecting her third child.   Social Drivers of Corporate investment banker Strain: Not on file  Food Insecurity: No Food Insecurity (11/28/2022)   Hunger Vital Sign    Worried About Running Out of Food in the Last Year: Never true    Ran Out of Food in the Last Year: Never true  Transportation Needs: No Transportation Needs (11/28/2022)   PRAPARE - Administrator, Civil Service (Medical): No    Lack of Transportation (Non-Medical): No  Physical Activity: Not on file  Stress: Not on file  Social Connections: Not on file  Intimate Partner Violence: Not At Risk (11/28/2022)   Humiliation, Afraid, Rape, and Kick questionnaire    Fear of Current or Ex-Partner: No    Emotionally Abused: No    Physically Abused: No    Sexually Abused: No    Family History  Problem Relation Age of Onset   Migraines Mother     Allergies as of 06/06/2024   (No Known Allergies)    Current Outpatient Medications on File Prior to Visit  Medication Sig Dispense Refill   acetaminophen  (TYLENOL ) 325 MG tablet Take 2 tablets (650 mg total) by mouth every 6 (six) hours as needed for mild pain or moderate pain.     benzonatate  (TESSALON ) 100 MG capsule Take 1 capsule (100 mg total) by mouth every 8 (eight) hours. 21 capsule 0   promethazine -dextromethorphan (PROMETHAZINE -DM) 6.25-15 MG/5ML syrup Take 5 mLs by mouth at bedtime as needed for cough. 118 mL 0   RIVAROXABAN  (XARELTO ) VTE STARTER PACK (15 & 20 MG) Follow package  directions: Take one 15mg  tablet by mouth twice a day. On day 22, switch to one 20mg  tablet once a day. Take with food. 51 each 0   No current facility-administered medications on file prior to visit.   REVIEW OF SYSTEMS:  ROS PHYSICAL EXAMINATION:  There were no vitals filed for this visit.  There is no height or weight on file to calculate BMI.  Physical Exam Villalta Score for Post-Thrombotic Syndrome:    LABS:  CBC     Component Value Date/Time   WBC 4.7 06/03/2024 1123   RBC 5.23 06/03/2024 1123   HGB 15.8 06/03/2024 1123   HCT 46.5 06/03/2024 1123   PLT 199 06/03/2024 1123   MCV 88.9 06/03/2024 1123   MCH 30.2 06/03/2024 1123   MCHC 34.0 06/03/2024 1123   RDW 14.7 06/03/2024 1123   LYMPHSABS 1.7 06/03/2024 1123   MONOABS 0.5 06/03/2024 1123   EOSABS 0.4 06/03/2024 1123   BASOSABS 0.0 06/03/2024 1123    Hepatic Function      Component Value Date/Time   PROT 7.4 12/30/2022 1132   ALBUMIN 4.4 12/30/2022 1132   AST 15 12/30/2022 1132   ALT 13 12/30/2022 1132   ALKPHOS 90 12/30/2022 1132   BILITOT 0.5 12/30/2022 1132    Renal Function   Lab Results  Component Value Date   CREATININE 0.65 06/03/2024   CREATININE 0.98 05/05/2023   CREATININE 0.92 03/19/2023    Estimated Creatinine Clearance: 133.4 mL/min (by C-G formula based on SCr of 0.65 mg/dL).   VVS Vascular Lab Studies:  06/03/24 doppler FINDINGS: VENOUS   Normal compressibility of the common femoral, superficial femoral, and popliteal veins, as well as the visualized calf veins in the left lower extremity. Visualized portions of profunda femoral vein and great saphenous vein unremarkable in the left lower extremity.   No filling defects to suggest DVT on grayscale or color Doppler imaging in the left lower extremity. Doppler waveforms show normal direction of venous flow, normal respiratory plasticity and response to augmentation in the left lower extremity.   In the right lower extremity there  is normal compressibility of the common femoral vein, however the superficial femoral vein and popliteal veins demonstrate filling defect into cues flow consistent with acute DVT. The femoral vein DVT is nonocclusive, however the popliteal vein and gastrocnemius vein appear to have acute occlusive thrombus.   OTHER   None.   Limitations: none   IMPRESSION: Positive acute DVT involving the femoral vein, popliteal vein, and gastrocnemius vein in the right lower extremity. Negative for DVT in the left lower extremity. These findings called to the ER physician at [facility].  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 Recurrent events have been in the setting of noncompliance to anticoagulation and neglecting follow up. Previously seen by [facility] hematology who recommended indefinite anticoagulation. Xarelto  was restarted with the VTE starter pack dosing. ***  PLAN: {DVT Clinic ZOXW:96045}  Follow up: ***  Faye Hoops, PharmD, BCACP, CPP Deep Vein Thrombosis Clinic Clinical Pharmacist Practitioner [phone]

## 2024-06-06 ENCOUNTER — Ambulatory Visit: Admitting: Student-PharmD

## 2024-06-07 NOTE — Progress Notes (Unsigned)
 DVT Clinic Note  Name: Ricky Carroll     MRN: [id number]     DOB: Oct 03, 2006     Sex: male  PCP: Lavada Porteous, DO  Today's Visit:    Referred to DVT Clinic by: Emergency Department - Katherine Pancake, PA-C Referred to CPP by: {CPP Referral Provider:[id number]} Reason for referral: No chief complaint on file.  HISTORY OF PRESENT ILLNESS: Ricky Carroll is a 18 y.o. male with PMH multiple DVT/PE who presents after diagnosis of DVT for medication management. Patient has previously been established with [facility] hematology with Dr. Veneta Gilding for recurrent PE, last seen 12/30/22. 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 He discontinued anticoagulation after only about two months after the initial PE due to no showing follow up. The plan after the second PE was lifelong anticoagulation but patient neglected to follow up with hematology as well and ultimately stopped taking Xarelto  after a couple months again. He was recently seen in the ED 06/03/24 with pain and tightness in the right calf. Ultrasound showed acute right femoropopliteal DVT. ED provider spoke with Dr. Fulton Job who recommended no acute vascular intervention, restart DOAC, and follow up in DVT Clinic.   Today, patient reports ***. *** abnormal bleeding or bruising. *** missed doses of ***. *** wearing compression stockings.            Rx Insurance Coverage: Medicaid Rx Affordability: *** Rx Assistance Provided: {Rx Assistance Provided:[id number]} Preferred Pharmacy: ***  Past Medical History:  Diagnosis Date   DVT (deep venous thrombosis) (HCC)     Past Surgical History:  Procedure Laterality Date   TONSILLECTOMY     TONSILLECTOMY     TONSILLECTOMY AND ADENOIDECTOMY Bilateral     Social History   Socioeconomic History   Marital status: Single    Spouse name: Not on file   Number of children: Not on file   Years of education: Not on file   Highest education level: Not on  file  Occupational History   Not on file  Tobacco Use   Smoking status: Some Days    Types: Cigars    Passive exposure: Yes   Smokeless tobacco: Never  Vaping Use   Vaping status: Some Days  Substance and Sexual Activity   Alcohol use: No   Drug use: No   Sexual activity: Yes  Other Topics Concern   Not on file  Social History Narrative   Ricky Carroll is in fifth grade at [employer]. He is doing well.   Lives with his mother and older brother. Mother is expecting her third child.   Social Drivers of Corporate investment banker Strain: Not on file  Food Insecurity: No Food Insecurity (11/28/2022)   Hunger Vital Sign    Worried About Running Out of Food in the Last Year: Never true    Ran Out of Food in the Last Year: Never true  Transportation Needs: No Transportation Needs (11/28/2022)   PRAPARE - Administrator, Civil Service (Medical): No    Lack of Transportation (Non-Medical): No  Physical Activity: Not on file  Stress: Not on file  Social Connections: Not on file  Intimate Partner Violence: Not At Risk (11/28/2022)   Humiliation, Afraid, Rape, and Kick questionnaire    Fear of Current or Ex-Partner: No    Emotionally Abused: No    Physically Abused: No    Sexually Abused: No    Family History  Problem Relation Age of Onset   Migraines Mother     Allergies as of 06/10/2024   (No Known Allergies)    Current Outpatient Medications on File Prior to Visit  Medication Sig Dispense Refill   acetaminophen  (TYLENOL ) 325 MG tablet Take 2 tablets (650 mg total) by mouth every 6 (six) hours as needed for mild pain or moderate pain.     benzonatate  (TESSALON ) 100 MG capsule Take 1 capsule (100 mg total) by mouth every 8 (eight) hours. 21 capsule 0   promethazine -dextromethorphan (PROMETHAZINE -DM) 6.25-15 MG/5ML syrup Take 5 mLs by mouth at bedtime as needed for cough. 118 mL 0   RIVAROXABAN  (XARELTO ) VTE STARTER PACK (15 & 20 MG) Follow package  directions: Take one 15mg  tablet by mouth twice a day. On day 22, switch to one 20mg  tablet once a day. Take with food. 51 each 0   No current facility-administered medications on file prior to visit.   REVIEW OF SYSTEMS:  ROS PHYSICAL EXAMINATION:  There were no vitals filed for this visit.  There is no height or weight on file to calculate BMI.  Physical Exam Villalta Score for Post-Thrombotic Syndrome:    LABS:  CBC     Component Value Date/Time   WBC 4.7 06/03/2024 1123   RBC 5.23 06/03/2024 1123   HGB 15.8 06/03/2024 1123   HCT 46.5 06/03/2024 1123   PLT 199 06/03/2024 1123   MCV 88.9 06/03/2024 1123   MCH 30.2 06/03/2024 1123   MCHC 34.0 06/03/2024 1123   RDW 14.7 06/03/2024 1123   LYMPHSABS 1.7 06/03/2024 1123   MONOABS 0.5 06/03/2024 1123   EOSABS 0.4 06/03/2024 1123   BASOSABS 0.0 06/03/2024 1123    Hepatic Function      Component Value Date/Time   PROT 7.4 12/30/2022 1132   ALBUMIN 4.4 12/30/2022 1132   AST 15 12/30/2022 1132   ALT 13 12/30/2022 1132   ALKPHOS 90 12/30/2022 1132   BILITOT 0.5 12/30/2022 1132    Renal Function   Lab Results  Component Value Date   CREATININE 0.65 06/03/2024   CREATININE 0.98 05/05/2023   CREATININE 0.92 03/19/2023    Estimated Creatinine Clearance: 133.4 mL/min (by C-G formula based on SCr of 0.65 mg/dL).   VVS Vascular Lab Studies:  06/03/24 doppler FINDINGS: VENOUS   Normal compressibility of the common femoral, superficial femoral, and popliteal veins, as well as the visualized calf veins in the left lower extremity. Visualized portions of profunda femoral vein and great saphenous vein unremarkable in the left lower extremity.   No filling defects to suggest DVT on grayscale or color Doppler imaging in the left lower extremity. Doppler waveforms show normal direction of venous flow, normal respiratory plasticity and response to augmentation in the left lower extremity.   In the right lower extremity there  is normal compressibility of the common femoral vein, however the superficial femoral vein and popliteal veins demonstrate filling defect into cues flow consistent with acute DVT. The femoral vein DVT is nonocclusive, however the popliteal vein and gastrocnemius vein appear to have acute occlusive thrombus.   OTHER   None.   Limitations: none   IMPRESSION: Positive acute DVT involving the femoral vein, popliteal vein, and gastrocnemius vein in the right lower extremity. Negative for DVT in the left lower extremity. These findings called to the ER physician at [facility].  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 Recurrent events have been in the setting of noncompliance to anticoagulation and neglecting follow up. Previously seen by [facility] hematology who recommended indefinite anticoagulation. Xarelto  was restarted with the VTE starter pack dosing. ***  PLAN: {DVT Clinic NWGN:56213}  Follow up: ***  Faye Hoops, PharmD, BCACP, CPP Deep Vein Thrombosis Clinic Clinical Pharmacist Practitioner [phone]

## 2024-06-10 ENCOUNTER — Ambulatory Visit: Attending: Surgery | Admitting: Student-PharmD

## 2024-06-10 ENCOUNTER — Encounter: Payer: Self-pay | Admitting: Student-PharmD

## 2024-06-10 VITALS — BP 101/70 | HR 62 | Wt 140.3 lb

## 2024-06-10 DIAGNOSIS — I82411 Acute embolism and thrombosis of right femoral vein: Secondary | ICD-10-CM | POA: Insufficient documentation

## 2024-06-10 MED ORDER — RIVAROXABAN 20 MG PO TABS: 20.0000 mg | ORAL_TABLET | Freq: Every day | ORAL | 1 refills | Status: DC

## 2024-06-10 NOTE — Patient Instructions (Signed)
-  Continue rivaroxaban  (Xarelto ) 15 mg twice daily with food for 21 days followed by 20 mg daily with food. -Your refills have been sent to your [facility] in [place]. You may need to call the pharmacy to ask them to fill this when you start to run low on your current supply.  -You need to call and schedule an appointment with Dr. Howell to get back into primary care: [phone] -We are also referring you back to hematology. It will be important to keep appointments with primary care and hematology long-term.  -It is important to take your medication around the same time every day.  -Avoid NSAIDs like ibuprofen  (Advil , Motrin ) and naproxen (Aleve) as well as aspirin doses over 100 mg daily. -Tylenol  (acetaminophen ) is the preferred over the counter pain medication to lower the risk of bleeding. -Be sure to alert all of your health care providers that you are taking an anticoagulant prior to starting a new medication or having a procedure. -Monitor for signs and symptoms of bleeding (abnormal bruising, prolonged bleeding, nose bleeds, bleeding from gums, discolored urine, black tarry stools). If you have fallen and hit your head OR if your bleeding is severe or not stopping, seek emergency care.  -Go to the emergency room if emergent signs and symptoms of new clot occur (new or worse swelling and pain in an arm or leg, shortness of breath, chest pain, fast or irregular heartbeats, lightheadedness, dizziness, fainting, coughing up blood) or if you experience a significant color change (pale or blue) in the extremity that has the DVT.  -We recommend you wear compression stockings (20-30 mmHg) as long as you are having swelling or pain. Be sure to purchase the correct size and take them off at night.   If you have any questions or need to reschedule an appointment, please call [phone]. If you are having an emergency, call 911 or present to the nearest emergency room.   What is a DVT?  -Deep vein  thrombosis (DVT) is a condition in which a blood clot forms in a vein of the deep venous system which can occur in the lower leg, thigh, pelvis, arm, or neck. This condition is serious and can be life-threatening if the clot travels to the arteries of the lungs and causing a blockage (pulmonary embolism, PE). A DVT can also damage veins in the leg, which can lead to long-term venous disease, leg pain, swelling, discoloration, and ulcers or sores (post-thrombotic syndrome).  -Treatment may include taking an anticoagulant medication to prevent more clots from forming and the current clot from growing, wearing compression stockings, and/or surgical procedures to remove or dissolve the clot.

## 2024-07-01 ENCOUNTER — Encounter: Payer: Self-pay | Admitting: *Deleted

## 2024-07-01 ENCOUNTER — Inpatient Hospital Stay

## 2024-07-01 ENCOUNTER — Inpatient Hospital Stay: Attending: Hematology | Admitting: Hematology

## 2024-07-01 VITALS — BP 126/85 | HR 61 | Temp 97.8°F | Resp 16 | Wt 140.0 lb

## 2024-07-01 DIAGNOSIS — F17290 Nicotine dependence, other tobacco product, uncomplicated: Secondary | ICD-10-CM | POA: Insufficient documentation

## 2024-07-01 DIAGNOSIS — Z9089 Acquired absence of other organs: Secondary | ICD-10-CM | POA: Insufficient documentation

## 2024-07-01 DIAGNOSIS — Z7901 Long term (current) use of anticoagulants: Secondary | ICD-10-CM | POA: Diagnosis not present

## 2024-07-01 DIAGNOSIS — R5383 Other fatigue: Secondary | ICD-10-CM | POA: Diagnosis not present

## 2024-07-01 DIAGNOSIS — Z79899 Other long term (current) drug therapy: Secondary | ICD-10-CM | POA: Insufficient documentation

## 2024-07-01 DIAGNOSIS — Z86711 Personal history of pulmonary embolism: Secondary | ICD-10-CM | POA: Diagnosis present

## 2024-07-01 DIAGNOSIS — I82511 Chronic embolism and thrombosis of right femoral vein: Secondary | ICD-10-CM | POA: Insufficient documentation

## 2024-07-01 DIAGNOSIS — Z8249 Family history of ischemic heart disease and other diseases of the circulatory system: Secondary | ICD-10-CM | POA: Insufficient documentation

## 2024-07-01 DIAGNOSIS — R519 Headache, unspecified: Secondary | ICD-10-CM | POA: Insufficient documentation

## 2024-07-01 NOTE — Patient Instructions (Addendum)
 You were seen and examined today by Dr. Rogers. Dr. Rogers is a hematologist, meaning that he specializes in blood abnormalities. Dr. Rogers discussed your past medical history, family history of cancers/blood conditions and the events that led to you being here today.  You were referred to Dr. Rogers due to deep vein thrombosis (DVT - blood clot in the leg).  Follow-up as scheduled.

## 2024-07-01 NOTE — Progress Notes (Signed)
 [facility] [address] [place] [address]   Clinic Day:  07/01/2024  Referring physician: Howell Lunger, DO  Patient Care Team: Howell Lunger, DO as PCP - General (Family Medicine)   ASSESSMENT & PLAN:   Assessment:  1.  Recurrent unprovoked DVT/PE: - 01/17/2022: Bilateral pulmonary emboli, right greater than left with evidence of mild right heart strain.  He had right leg DVT at that time.  He was briefly treated with anticoagulation for 2 months and self discontinued. - 11/24/2022: Small volume, right greater than left pulmonary emboli, decreased clot burden compared to 01/17/2022. - Bilateral leg ultrasound (11/24/2022): No evidence of DVT in the lower extremities. - He took anticoagulation for few months and failed to follow-up. - He presented to the ER on 06/03/2024 with right leg pain. - 06/03/2024: Bilateral lower extremity Doppler: Positive acute DVT involving femoral vein, popliteal vein and gastrocnemius vein in the right leg.  Negative for DVT in the left leg. - He started taking Xarelto  starter pack on 06/10/2024.  2.  Social/family history: - Father had history of DVT/PE. - No family history of malignancies.  Plan:  1.  Recurrent unprovoked DVT/PE: - I have reviewed imaging findings.  He will need indefinite anticoagulation based on 3 episodes of unprovoked DVT/PE.  I have discussed at great length about compliance to his anticoagulation.  He is tolerating Xarelto  very well.  He does not have any significant co-pay.  He will continue Xarelto  20 mg daily indefinitely.  I have made sure that he has enough refills for the next 6 months.  We will reevaluate him in 6 months to make sure he is tolerating and taking Xarelto .  If he is compliant, he will be seen once a year in our clinic.  No additional workup is necessary.   Orders Placed This Encounter  Procedures   D-dimer, quantitative    Standing Status:   Future    Expected Date:   01/01/2025     Expiration Date:   07/01/2025      LILLETTE Verneta SAUNDERS Teague,acting as a scribe for [name], MD.,have documented all relevant documentation on the behalf of [name], MD,as directed by  [name], MD while in the presence of [name], MD.   I, [name] MD, have reviewed the above documentation for accuracy and completeness, and I agree with the above.   [name], MD   7/14/20254:14 PM  CHIEF COMPLAINT/PURPOSE OF CONSULT:   Diagnosis: Recurrent DVT/PE  Current Therapy: Xarelto   HISTORY OF PRESENT ILLNESS:   [name] is a 18 y.o. male presenting to clinic today for evaluation of recurrent DVT's and PE's at the request of [name], [name], MD.  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   Ricky Carroll had his first diagnosis of bilateral pulmonary embolism on 01/17/2022. CTA chest showed bilateral PE  right greater than left with mild right-sided heart strain. He was admitted the day after at [facility] for bilateral PE and DVT of right lower extremity, including femoral, profunda femoral, popliteal, gastrocnemius, anterior tibial, posterior tibial, and peroneal veins. During this admission, he was noted to have a paternal family history of a clotting disorder and was started on Xarelto  when discharged on 01/19/22. Bilbo followed-up with pediatric hematology for 6 months and had hypercoagulability testing done on 01/18/2022 before being lost to follow-up. Testing showed low Protein S activity at 32, negative lupus anticoagulant, negative Factor V Leiden, and negative  Prothrombin gene mutation.   He was admitted to the hospital again from 11/24/2022 to 11/29/2022 for bilateral PE and chronic DVT of right femoral vein. He was noted to be on Xarelto  for 2 months after prior admission before discontinuing the medication and was restarted on Xarelto  after discharge on 12/12. Perlie was then seen by [facility] hematology  on 12/30/2022 who recommended indefinite anticoagulation and reported he was taking the blood thinner as prescribed. Repeat testing for clotting disorders was done on 12/30/22 with all testing negative.  He presented to the ED on 03/19/23 and on 05/05/23 for bilateral PE and noted he had not taken Xarelto  since January 2024. Abdulloh was again seen in the ED on 06/03/24 for an acute DVT involving the femoral vein, popliteal vein, and gastrocnemius vein in the right lower extremity. He was discharged with Xarelto  and seen by the DVT clinic on 06/10/24, who then referred patient to me.   Today, he states that he is doing well overall. His appetite level is at 100%. His energy level is at 75%.  PAST MEDICAL HISTORY:   Past Medical History: Past Medical History:  Diagnosis Date   DVT (deep venous thrombosis) (HCC)     Surgical History: Past Surgical History:  Procedure Laterality Date   TONSILLECTOMY     TONSILLECTOMY     TONSILLECTOMY AND ADENOIDECTOMY Bilateral     Social History: Social History   Socioeconomic History   Marital status: Single    Spouse name: Not on file   Number of children: Not on file   Years of education: Not on file   Highest education level: Not on file  Occupational History   Not on file  Tobacco Use   Smoking status: Some Days    Types: Cigars    Passive exposure: Yes   Smokeless tobacco: Never  Vaping Use   Vaping status: Some Days  Substance and Sexual Activity   Alcohol use: No   Drug use: No   Sexual activity: Yes  Other Topics Concern   Not on file  Social History Narrative   Ricky Carroll is in fifth grade at [employer]. He is doing well.   Lives with his mother and older brother. Mother is expecting her third child.   Social Drivers of Corporate investment banker Strain: Not on file  Food Insecurity: No Food Insecurity (07/01/2024)   Hunger Vital Sign    Worried About Running Out of Food in the Last Year: Never true    Ran Out of Food  in the Last Year: Never true  Transportation Needs: No Transportation Needs (07/01/2024)   PRAPARE - Administrator, Civil Service (Medical): No    Lack of Transportation (Non-Medical): No  Physical Activity: Not on file  Stress: Not on file  Social Connections: Not on file  Intimate Partner Violence: Not At Risk (07/01/2024)   Humiliation, Afraid, Rape, and Kick questionnaire    Fear of Current or Ex-Partner: No    Emotionally Abused: No    Physically Abused: No    Sexually Abused: No    Family History: Family History  Problem Relation Age of Onset   Migraines Mother     Current Medications:  Current Outpatient Medications:    rivaroxaban  (XARELTO ) 20 MG TABS tablet, Take 1 tablet (20 mg total) by mouth daily with supper. Take with food., Disp: 90 tablet, Rfl: 1   RIVAROXABAN  (XARELTO ) VTE STARTER PACK (15 & 20 MG), Follow package directions:  Take one 15mg  tablet by mouth twice a day. On day 22, switch to one 20mg  tablet once a day. Take with food., Disp: 51 each, Rfl: 0   Allergies: No Known Allergies  REVIEW OF SYSTEMS:   Review of Systems  Constitutional:  Positive for fatigue. Negative for chills and fever.  HENT:   Negative for lump/mass, mouth sores, nosebleeds, sore throat and trouble swallowing.   Eyes:  Negative for eye problems.  Respiratory:  Negative for cough and shortness of breath.   Cardiovascular:  Negative for chest pain, leg swelling and palpitations.  Gastrointestinal:  Negative for abdominal pain, constipation, diarrhea, nausea and vomiting.  Genitourinary:  Negative for bladder incontinence, difficulty urinating, dysuria, frequency, hematuria and nocturia.   Musculoskeletal:  Negative for arthralgias, back pain, flank pain, myalgias and neck pain.  Skin:  Negative for itching and rash.  Neurological:  Positive for headaches. Negative for dizziness and numbness.  Hematological:  Does not bruise/bleed easily.  Psychiatric/Behavioral:   Negative for depression, sleep disturbance and suicidal ideas. The patient is not nervous/anxious.   All other systems reviewed and are negative.    VITALS:   Blood pressure 126/85, pulse 61, temperature 97.8 F (36.6 C), temperature source Oral, resp. rate 16, weight 139 lb 15.9 oz (63.5 kg), SpO2 100%.  Wt Readings from Last 3 Encounters:  07/01/24 139 lb 15.9 oz (63.5 kg) (26%, Z= -0.64)*  06/10/24 140 lb 4.8 oz (63.6 kg) (27%, Z= -0.62)*  06/03/24 140 lb (63.5 kg) (26%, Z= -0.63)*   * Growth percentiles are based on CDC (Boys, 2-20 Years) data.    Body mass index is 20.67 kg/m.   PHYSICAL EXAM:   Physical Exam Vitals and nursing note reviewed. Exam conducted with a chaperone present.  Constitutional:      Appearance: Normal appearance.  Cardiovascular:     Rate and Rhythm: Normal rate and regular rhythm.     Pulses: Normal pulses.     Heart sounds: Normal heart sounds.  Pulmonary:     Effort: Pulmonary effort is normal.     Breath sounds: Normal breath sounds.  Abdominal:     Palpations: Abdomen is soft. There is no hepatomegaly, splenomegaly or mass.     Tenderness: There is no abdominal tenderness.  Musculoskeletal:     Right lower leg: No edema.     Left lower leg: No edema.  Lymphadenopathy:     Cervical: No cervical adenopathy.     Right cervical: No superficial, deep or posterior cervical adenopathy.    Left cervical: No superficial, deep or posterior cervical adenopathy.     Upper Body:     Right upper body: No supraclavicular or axillary adenopathy.     Left upper body: No supraclavicular or axillary adenopathy.  Neurological:     General: No focal deficit present.     Mental Status: He is alert and oriented to person, place, and time.  Psychiatric:        Mood and Affect: Mood normal.        Behavior: Behavior normal.     LABS:   CBC    Component Value Date/Time   WBC 4.7 06/03/2024 1123   RBC 5.23 06/03/2024 1123   HGB 15.8 06/03/2024 1123    HCT 46.5 06/03/2024 1123   PLT 199 06/03/2024 1123   MCV 88.9 06/03/2024 1123   MCH 30.2 06/03/2024 1123   MCHC 34.0 06/03/2024 1123   RDW 14.7 06/03/2024 1123   LYMPHSABS 1.7 06/03/2024 1123  MONOABS 0.5 06/03/2024 1123   EOSABS 0.4 06/03/2024 1123   BASOSABS 0.0 06/03/2024 1123    CMP    Component Value Date/Time   NA 139 06/03/2024 1123   K 3.8 06/03/2024 1123   CL 106 06/03/2024 1123   CO2 25 06/03/2024 1123   GLUCOSE 85 06/03/2024 1123   BUN 12 06/03/2024 1123   CREATININE 0.65 06/03/2024 1123   CALCIUM  9.3 06/03/2024 1123   PROT 7.4 12/30/2022 1132   ALBUMIN 4.4 12/30/2022 1132   AST 15 12/30/2022 1132   ALT 13 12/30/2022 1132   ALKPHOS 90 12/30/2022 1132   BILITOT 0.5 12/30/2022 1132   GFRNONAA >60 06/03/2024 1123   GFRAA NOT CALCULATED 04/06/2015 2137    No results found for: CEA1, CEA / No results found for: CEA1, CEA No results found for: PSA1 No results found for: CAN199 No results found for: CAN125  No results found for: TOTALPROTELP, ALBUMINELP, A1GS, A2GS, BETS, BETA2SER, GAMS, MSPIKE, SPEI No results found for: TIBC, FERRITIN, IRONPCTSAT No results found for: LDH   STUDIES:   US  Venous Img Lower Bilateral Result Date: 06/03/2024 CLINICAL DATA:  Right calf pain with bilateral swelling. History of DVT. Patient not currently on anticoagulation. History of tobacco use. History of prior PE. EXAM: Bilateral LOWER EXTREMITY VENOUS DOPPLER ULTRASOUND TECHNIQUE: Gray-scale sonography with compression, as well as color and duplex ultrasound, were performed to evaluate the deep venous system(s) from the level of the common femoral vein through the popliteal and proximal calf veins. COMPARISON:  Bilateral FINDINGS: VENOUS Normal compressibility of the common femoral, superficial femoral, and popliteal veins, as well as the visualized calf veins in the left lower extremity. Visualized portions of profunda femoral vein and great  saphenous vein unremarkable in the left lower extremity. No filling defects to suggest DVT on grayscale or color Doppler imaging in the left lower extremity. Doppler waveforms show normal direction of venous flow, normal respiratory plasticity and response to augmentation in the left lower extremity. In the right lower extremity there is normal compressibility of the common femoral vein, however the superficial femoral vein and popliteal veins demonstrate filling defect into cues flow consistent with acute DVT. The femoral vein DVT is nonocclusive, however the popliteal vein and gastrocnemius vein appear to have acute occlusive thrombus. OTHER None. Limitations: none IMPRESSION: Positive acute DVT involving the femoral vein, popliteal vein, and gastrocnemius vein in the right lower extremity. Negative for DVT in the left lower extremity. These findings called to the ER physician at [facility]. Electronically Signed   By: Cordella Banner   On: 06/03/2024 12:46

## 2024-12-11 ENCOUNTER — Telehealth: Payer: Self-pay

## 2024-12-11 NOTE — Telephone Encounter (Signed)
 Patient calls nurse line requesting a refill on xarelto .   Patient has not been seen since January 2024.  Patient scheduled with PCP for 12/29.  He reports he has been taking this medication consistently through vascular.   Advised will forward to PCP for advisement.

## 2024-12-14 MED ORDER — RIVAROXABAN 20 MG PO TABS: 20.0000 mg | ORAL_TABLET | Freq: Every day | ORAL | 1 refills | Status: DC

## 2024-12-14 MED ORDER — RIVAROXABAN 20 MG PO TABS: 20.0000 mg | ORAL_TABLET | Freq: Every day | ORAL | 1 refills | Status: AC

## 2024-12-16 ENCOUNTER — Ambulatory Visit: Payer: Self-pay | Admitting: Student

## 2024-12-31 ENCOUNTER — Inpatient Hospital Stay: Admitting: Oncology

## 2024-12-31 ENCOUNTER — Inpatient Hospital Stay: Attending: Hematology
